# Patient Record
Sex: Female | Born: 1965 | Race: Black or African American | Hispanic: No | Marital: Single | Smoking: Current every day smoker
Health system: Southern US, Community
[De-identification: ages and names within clinical notes are randomized; demographics above are authoritative.]

## PROBLEM LIST (undated history)

## (undated) DIAGNOSIS — K5909 Other constipation: Secondary | ICD-10-CM

## (undated) DIAGNOSIS — K5010 Crohn's disease of large intestine without complications: Secondary | ICD-10-CM

## (undated) HISTORY — PX: TUBAL LIGATION: SHX77

---

## 2012-02-14 ENCOUNTER — Emergency Department: Payer: Self-pay | Admitting: Emergency Medicine

## 2012-02-14 LAB — BASIC METABOLIC PANEL
Anion Gap: 8 (ref 7–16)
Calcium, Total: 8.3 mg/dL — ABNORMAL LOW (ref 8.5–10.1)
Co2: 25 mmol/L (ref 21–32)
Creatinine: 0.64 mg/dL (ref 0.60–1.30)
EGFR (African American): >60
EGFR (Non-African Amer.): >60
Glucose: 86 mg/dL (ref 65–99)
Osmolality: 285 (ref 275–301)
Sodium: 144 mmol/L (ref 136–145)

## 2012-02-14 LAB — URINALYSIS, COMPLETE
Bacteria: NONE SEEN
Bilirubin,UR: NEGATIVE
Glucose,UR: NEGATIVE mg/dL (ref 0–75)
Leukocyte Esterase: NEGATIVE
Nitrite: NEGATIVE
RBC,UR: 24 /HPF (ref 0–5)
Specific Gravity: 1.025 (ref 1.003–1.030)
WBC UR: 1 /HPF (ref 0–5)

## 2012-02-14 LAB — CBC WITH DIFFERENTIAL/PLATELET
Basophil #: 0.1 10*3/uL (ref 0.0–0.1)
Eosinophil #: 0.1 10*3/uL (ref 0.0–0.7)
HGB: 11.2 g/dL — ABNORMAL LOW (ref 12.0–16.0)
MCH: 21.3 pg — ABNORMAL LOW (ref 26.0–34.0)
MCHC: 31.7 g/dL — ABNORMAL LOW (ref 32.0–36.0)
Monocyte #: 0.6 x10 3/mm (ref 0.2–0.9)
Monocyte %: 9.1 %
Neutrophil %: 58.4 %
Platelet: 199 10*3/uL (ref 150–440)
RBC: 5.25 10*6/uL — ABNORMAL HIGH (ref 3.80–5.20)

## 2012-09-07 ENCOUNTER — Emergency Department: Payer: Self-pay | Admitting: Emergency Medicine

## 2012-09-07 LAB — URINALYSIS, COMPLETE
Bacteria: NONE SEEN
Ketone: NEGATIVE
Nitrite: NEGATIVE
Ph: 5 (ref 4.5–8.0)
Protein: 30
RBC,UR: 17 /HPF (ref 0–5)
Specific Gravity: 1.025 (ref 1.003–1.030)
Squamous Epithelial: 21

## 2012-09-07 LAB — COMPREHENSIVE METABOLIC PANEL
Albumin: 3.9 g/dL (ref 3.4–5.0)
Anion Gap: 6 — ABNORMAL LOW (ref 7–16)
BUN: 9 mg/dL (ref 7–18)
Bilirubin,Total: 0.4 mg/dL (ref 0.2–1.0)
Calcium, Total: 9.1 mg/dL (ref 8.5–10.1)
Co2: 23 mmol/L (ref 21–32)
EGFR (Non-African Amer.): >60
Potassium: 3.6 mmol/L (ref 3.5–5.1)
SGOT(AST): 15 U/L (ref 15–37)
SGPT (ALT): 19 U/L (ref 12–78)
Total Protein: 7.7 g/dL (ref 6.4–8.2)

## 2012-09-07 LAB — CBC
HCT: 36.1 % (ref 35.0–47.0)
MCH: 21.5 pg — ABNORMAL LOW (ref 26.0–34.0)
MCHC: 33 g/dL (ref 32.0–36.0)
MCV: 65 fL — ABNORMAL LOW (ref 80–100)
Platelet: 212 10*3/uL (ref 150–440)
RDW: 15.4 % — ABNORMAL HIGH (ref 11.5–14.5)
WBC: 9.1 10*3/uL (ref 3.6–11.0)

## 2012-09-07 LAB — LIPASE, BLOOD: Lipase: 135 U/L (ref 73–393)

## 2012-10-18 ENCOUNTER — Emergency Department: Payer: Self-pay | Admitting: Emergency Medicine

## 2013-02-23 ENCOUNTER — Emergency Department: Payer: Self-pay | Admitting: Emergency Medicine

## 2013-02-23 LAB — CBC
HCT: 37.5 % (ref 35.0–47.0)
HGB: 12 g/dL (ref 12.0–16.0)
MCH: 21.4 pg — ABNORMAL LOW (ref 26.0–34.0)
MCV: 67 fL — ABNORMAL LOW (ref 80–100)
RDW: 16.2 % — ABNORMAL HIGH (ref 11.5–14.5)
WBC: 7.9 10*3/uL (ref 3.6–11.0)

## 2013-02-23 LAB — COMPREHENSIVE METABOLIC PANEL
BUN: 9 mg/dL (ref 7–18)
Bilirubin,Total: 0.2 mg/dL (ref 0.2–1.0)
Co2: 28 mmol/L (ref 21–32)
EGFR (African American): >60
Glucose: 124 mg/dL — ABNORMAL HIGH (ref 65–99)
Osmolality: 280 (ref 275–301)
Potassium: 3.1 mmol/L — ABNORMAL LOW (ref 3.5–5.1)
SGOT(AST): 29 U/L (ref 15–37)
SGPT (ALT): 29 U/L (ref 12–78)
Sodium: 140 mmol/L (ref 136–145)

## 2013-02-23 LAB — CK TOTAL AND CKMB (NOT AT ARMC): CK, Total: 94 U/L (ref 21–215)

## 2013-02-23 LAB — TROPONIN I: Troponin-I: <0.02 ng/mL

## 2013-02-23 LAB — PREGNANCY, URINE: Pregnancy Test, Urine: NEGATIVE m[IU]/mL

## 2016-02-04 ENCOUNTER — Emergency Department
Admission: EM | Admit: 2016-02-04 | Discharge: 2016-02-04 | Disposition: A | Discharge status: Home / Self Care | Payer: Self-pay | Attending: Emergency Medicine | Admitting: Emergency Medicine

## 2016-02-04 ENCOUNTER — Encounter: Payer: Self-pay | Admitting: Emergency Medicine

## 2016-02-04 ENCOUNTER — Emergency Department: Payer: Self-pay

## 2016-02-04 DIAGNOSIS — Z5321 Procedure and treatment not carried out due to patient leaving prior to being seen by health care provider: Secondary | ICD-10-CM | POA: Insufficient documentation

## 2016-02-04 DIAGNOSIS — F17200 Nicotine dependence, unspecified, uncomplicated: Secondary | ICD-10-CM | POA: Insufficient documentation

## 2016-02-04 DIAGNOSIS — R6883 Chills (without fever): Secondary | ICD-10-CM | POA: Insufficient documentation

## 2016-02-04 DIAGNOSIS — R05 Cough: Secondary | ICD-10-CM | POA: Insufficient documentation

## 2016-02-04 DIAGNOSIS — R059 Cough, unspecified: Secondary | ICD-10-CM

## 2016-02-04 DIAGNOSIS — R0602 Shortness of breath: Secondary | ICD-10-CM | POA: Insufficient documentation

## 2016-02-04 DIAGNOSIS — R0789 Other chest pain: Secondary | ICD-10-CM | POA: Insufficient documentation

## 2016-02-04 LAB — CBC
HCT: 41.2 % (ref 35.0–47.0)
Hemoglobin: 13.2 g/dL (ref 12.0–16.0)
MCH: 20.8 pg — AB (ref 26.0–34.0)
MCHC: 32.1 g/dL (ref 32.0–36.0)
MCV: 65 fL — AB (ref 80.0–100.0)
PLATELETS: 218 10*3/uL (ref 150–440)
RBC: 6.33 MIL/uL — ABNORMAL HIGH (ref 3.80–5.20)
RDW: 14.9 % — AB (ref 11.5–14.5)
WBC: 17.4 10*3/uL — AB (ref 3.6–11.0)

## 2016-02-04 LAB — BASIC METABOLIC PANEL
Anion gap: 10 (ref 5–15)
BUN: 7 mg/dL (ref 6–20)
CHLORIDE: 103 mmol/L (ref 101–111)
CO2: 24 mmol/L (ref 22–32)
CREATININE: 0.69 mg/dL (ref 0.44–1.00)
Calcium: 9.7 mg/dL (ref 8.9–10.3)
GFR calc Af Amer: >60 mL/min (ref >60)
GFR calc non Af Amer: >60 mL/min (ref >60)
Glucose, Bld: 133 mg/dL — ABNORMAL HIGH (ref 65–99)
Potassium: 3.9 mmol/L (ref 3.5–5.1)
SODIUM: 137 mmol/L (ref 135–145)

## 2016-02-04 LAB — TROPONIN I: Troponin I: 0.04 ng/mL (ref <0.03)

## 2016-02-04 NOTE — ED Provider Notes (Signed)
Patient eloped before being brought back for evaluation.   Lisa Roca, MD 02/04/16 2239

## 2016-02-04 NOTE — ED Notes (Signed)
Patient called x 3 to present for care at [place], [facility], and [facility]; no response. Patient removed from status board. Registration staff made aware.

## 2016-02-04 NOTE — ED Triage Notes (Signed)
Patient presents to the ED with her daughter complaining of cough that is productive, congestion, chills, shortness of breath and chest discomfort. Patient reports that the flu like symptoms started about a week ago when her granddaughter was sick. Had been taking OTC theraflu and even used her daughter inhaler after walking to store and developed the shortness of breath. Patient was very tearful and anxious with needles and reported the chest pressure then with this episode and starts crying off and on due to pressure in chest. Patient currently does not look to be in appearent distress.

## 2016-02-05 ENCOUNTER — Encounter: Payer: Self-pay | Admitting: Emergency Medicine

## 2016-02-05 ENCOUNTER — Telehealth: Payer: Self-pay | Admitting: Emergency Medicine

## 2016-02-05 ENCOUNTER — Emergency Department
Admission: EM | Admit: 2016-02-05 | Discharge: 2016-02-06 | Disposition: A | Discharge status: Home / Self Care | Payer: Self-pay | Attending: Emergency Medicine | Admitting: Emergency Medicine

## 2016-02-05 ENCOUNTER — Emergency Department: Payer: Self-pay

## 2016-02-05 DIAGNOSIS — F17200 Nicotine dependence, unspecified, uncomplicated: Secondary | ICD-10-CM | POA: Insufficient documentation

## 2016-02-05 DIAGNOSIS — J189 Pneumonia, unspecified organism: Secondary | ICD-10-CM

## 2016-02-05 DIAGNOSIS — R0789 Other chest pain: Secondary | ICD-10-CM | POA: Insufficient documentation

## 2016-02-05 DIAGNOSIS — Z5321 Procedure and treatment not carried out due to patient leaving prior to being seen by health care provider: Secondary | ICD-10-CM | POA: Insufficient documentation

## 2016-02-05 LAB — BASIC METABOLIC PANEL
Anion gap: 8 (ref 5–15)
BUN: 11 mg/dL (ref 6–20)
CALCIUM: 9.2 mg/dL (ref 8.9–10.3)
CHLORIDE: 103 mmol/L (ref 101–111)
CO2: 27 mmol/L (ref 22–32)
CREATININE: 0.71 mg/dL (ref 0.44–1.00)
GFR calc non Af Amer: >60 mL/min (ref >60)
Glucose, Bld: 101 mg/dL — ABNORMAL HIGH (ref 65–99)
Potassium: 3.7 mmol/L (ref 3.5–5.1)
SODIUM: 138 mmol/L (ref 135–145)

## 2016-02-05 LAB — CBC
HCT: 36.9 % (ref 35.0–47.0)
Hemoglobin: 12.1 g/dL (ref 12.0–16.0)
MCH: 20.9 pg — AB (ref 26.0–34.0)
MCHC: 32.7 g/dL (ref 32.0–36.0)
MCV: 63.8 fL — AB (ref 80.0–100.0)
PLATELETS: 217 10*3/uL (ref 150–440)
RBC: 5.78 MIL/uL — AB (ref 3.80–5.20)
RDW: 15 % — AB (ref 11.5–14.5)
WBC: 11.1 10*3/uL — ABNORMAL HIGH (ref 3.6–11.0)

## 2016-02-05 LAB — TROPONIN I

## 2016-02-05 MED ORDER — AZITHROMYCIN 250 MG PO TABS: ORAL_TABLET | ORAL | 0 refills | Status: AC

## 2016-02-05 MED ORDER — AZITHROMYCIN 500 MG PO TABS
500.0000 mg | ORAL_TABLET | Freq: Once | ORAL | Status: AC
  Administered 2016-02-06: 500 mg via ORAL
  Filled 2016-02-05: qty 1

## 2016-02-05 MED ORDER — DEXTROSE 5 % IV SOLN
1.0000 g | Freq: Once | INTRAVENOUS | Status: AC
  Administered 2016-02-06: 1 g via INTRAVENOUS
  Filled 2016-02-05: qty 10

## 2016-02-05 MED ORDER — IPRATROPIUM-ALBUTEROL 0.5-2.5 (3) MG/3ML IN SOLN
3.0000 mL | Freq: Once | RESPIRATORY_TRACT | Status: AC
  Administered 2016-02-06: 3 mL via RESPIRATORY_TRACT
  Filled 2016-02-05: qty 3

## 2016-02-05 MED ORDER — ALBUTEROL SULFATE HFA 108 (90 BASE) MCG/ACT IN AERS: 2.0000 | INHALATION_SPRAY | Freq: Four times a day (QID) | RESPIRATORY_TRACT | 2 refills | Status: DC | PRN

## 2016-02-05 MED ORDER — AMOXICILLIN-POT CLAVULANATE 875-125 MG PO TABS
1.0000 | ORAL_TABLET | Freq: Two times a day (BID) | ORAL | 0 refills | Status: AC
Start: 2016-02-05 — End: 2016-02-15

## 2016-02-05 NOTE — ED Provider Notes (Signed)
[facility] Emergency Department Provider Note   ____________________________________________   First MD Initiated Contact with Patient 02/05/16 2332     (approximate)  I have reviewed the triage vital signs and the nursing notes.   HISTORY  Chief Complaint Chest Pain   HPI Katelyn Garrett is a 50 y.o. female patient reports she's been sick for about a week and a half chest is been hurting for about 3 days hurts worse when she coughs she's been coughing running a fever of  101 also for the last 3-4 days. Cough is wet but nothing much is really coming up. Pain and cough for moderate shortness of breath is mild      History reviewed. No pertinent past medical history.  There are no active problems to display for this patient.   Past Surgical History:  Procedure Laterality Date  . TUBAL LIGATION      Prior to Admission medications   Medication Sig Start Date End Date Taking? Authorizing Provider  albuterol (PROVENTIL HFA;VENTOLIN HFA) 108 (90 Base) MCG/ACT inhaler Inhale 2 puffs into the lungs every 6 (six) hours as needed for wheezing or shortness of breath. 02/05/16   Nena Polio, MD  amoxicillin-clavulanate (AUGMENTIN) 875-125 MG tablet Take 1 tablet by mouth 2 (two) times daily. 02/05/16 02/15/16  Nena Polio, MD  azithromycin (ZITHROMAX Z-PAK) 250 MG tablet Take 2 tablets (500 mg) on  Day 1,  followed by 1 tablet (250 mg) once daily on Days 2 through 5. 02/05/16 02/10/16  Nena Polio, MD  ondansetron (ZOFRAN ODT) 4 MG disintegrating tablet Take 1 tablet (4 mg total) by mouth every 8 (eight) hours as needed for nausea or vomiting. 02/06/16   Nena Polio, MD    Allergies Review of patient's allergies indicates no known allergies.  History reviewed. No pertinent family history.  Social History Social History  Substance Use Topics  . Smoking status: Current Every Day Smoker  . Smokeless tobacco: Never Used  . Alcohol use No    Review  of Systems Constitutional:  fever/No chills Eyes: No visual changes. ENT: No sore throat. Cardiovascular: Some pleuritic chest pain Respiratory: Some shortness of breath. Gastrointestinal: No abdominal pain.  No nausea, no vomiting.  No diarrhea.  No constipation. Genitourinary: Negative for dysuria. Musculoskeletal: Negative for back pain. Skin: Negative for rash. Neurological: Negative for headaches, focal weakness or numbness.  10-point ROS otherwise negative.  ____________________________________________   PHYSICAL EXAM:  VITAL SIGNS: ED Triage Vitals  Enc Vitals Group     BP 02/05/16 2117 132/90     Pulse Rate 02/05/16 2117 86     Resp 02/05/16 2117 20     Temp 02/05/16 2117 99 F (37.2 C)     Temp Source 02/05/16 2117 Oral     SpO2 02/05/16 2117 94 %     Weight 02/05/16 2117 140 lb (63.5 kg)     Height 02/05/16 2117 5' 7"  (1.702 m)     Head Circumference --      Peak Flow --      Pain Score 02/05/16 2118 8     Pain Loc --      Pain Edu? --      Excl. in [place]? --     Constitutional: Alert and oriented. Well appearing and in no acute distress. Eyes: Conjunctivae are normal. PERRL. EOMI. Head: Atraumatic. Nose: No congestion/rhinnorhea. Mouth/Throat: Mucous membranes are moist.  Oropharynx non-erythematous. Neck: No stridor.  Cardiovascular: Normal rate,  regular rhythm. Grossly normal heart sounds.  Good peripheral circulation. Respiratory: Normal respiratory effort.  No retractions. Lungs CTAB! Gastrointestinal: Soft and nontender. No distention. No abdominal bruits. No CVA tenderness. Musculoskeletal: No lower extremity tenderness nor edema.  No joint effusions. Neurologic:  Normal speech and language. No gross focal neurologic deficits are appreciated.  Skin:  Skin is warm, dry and intact. No rash noted.   ____________________________________________   LABS (all labs ordered are listed, but only abnormal results are displayed)  Labs Reviewed  BASIC  METABOLIC PANEL - Abnormal; Notable for the following:       Result Value   Glucose, Bld 101 (*)    All other components within normal limits  CBC - Abnormal; Notable for the following:    WBC 11.1 (*)    RBC 5.78 (*)    MCV 63.8 (*)    MCH 20.9 (*)    RDW 15.0 (*)    All other components within normal limits  TROPONIN I   ____________________________________________  EKG  EKG read and interpreted by me shows normal sinus rhythm rate of 89 normal axis and normal EKG ____________________________________________  RADIOLOGY  EXAM: CHEST  2 VIEW  COMPARISON:  02/23/2013 chest radiograph.  FINDINGS: Stable cardiomediastinal silhouette within normal limits. Ill-defined left lower lobe opacity. Possible small left pleural effusion. No pneumothorax. Mild degenerative changes of the spine  IMPRESSION: Ill-defined left lower lobe opacity and possible small left effusion suspicious for pneumonia.  These results were called by telephone at the time of interpretation on 02/05/2016 at 10:33 pm to Dr. Eula Listen , who verbally acknowledged these results.   Electronically Signed   By: Kristine Garbe M.D.   On: 02/05/2016 22:34 ____________________________________________   PROCEDURES  Procedure(s) performed:   Procedures  Critical Care performed:  ____________________________________________   INITIAL IMPRESSION / ASSESSMENT AND PLAN / ED COURSE  Pertinent labs & imaging results that were available during my care of the patient were reviewed by me and considered in my medical decision making (see chart for details).    Clinical Course     ____________________________________________   FINAL CLINICAL IMPRESSION(S) / ED DIAGNOSES  Final diagnoses:  Community acquired pneumonia      NEW MEDICATIONS STARTED DURING THIS VISIT:  Discharge Medication List as of 02/06/2016  1:30 AM    START taking these medications   Details    albuterol (PROVENTIL HFA;VENTOLIN HFA) 108 (90 Base) MCG/ACT inhaler Inhale 2 puffs into the lungs every 6 (six) hours as needed for wheezing or shortness of breath., Starting Fri 02/05/2016, Print    amoxicillin-clavulanate (AUGMENTIN) 875-125 MG tablet Take 1 tablet by mouth 2 (two) times daily., Starting Fri 02/05/2016, Until Mon 02/15/2016, Print    azithromycin (ZITHROMAX Z-PAK) 250 MG tablet Take 2 tablets (500 mg) on  Day 1,  followed by 1 tablet (250 mg) once daily on Days 2 through 5., Print    ondansetron (ZOFRAN ODT) 4 MG disintegrating tablet Take 1 tablet (4 mg total) by mouth every 8 (eight) hours as needed for nausea or vomiting., Starting Sat 02/06/2016, Print         Note:  This document was prepared using Dragon voice recognition software and may include unintentional dictation errors.    Nena Polio, MD 02/06/16 [phone]

## 2016-02-05 NOTE — ED Notes (Addendum)
Patient not in room at this time. Will assess once back in room.

## 2016-02-05 NOTE — ED Triage Notes (Signed)
Pt ambulatory to triage in NAD, was here yesterday for CP but LWBS, called to come back due to elevated WBC.  Pt reports central CP described as tight, SOB, n/v.  Pt NAD at this time.

## 2016-02-05 NOTE — ED Provider Notes (Signed)
I was called by the radiologist for an abnormal chest x-ray which shows a left lower lobe pneumonia. The charge nurse is aware.   Eula Listen, MD 02/05/16 2234

## 2016-02-05 NOTE — Telephone Encounter (Signed)
Called patient due to lwot to inquire about condition and follow up plans. I explained elevated troponin and strongly encouraged patient to return for provider exam.

## 2016-02-06 MED ORDER — ONDANSETRON 4 MG PO TBDP: 4.0000 mg | ORAL_TABLET | Freq: Three times a day (TID) | ORAL | 0 refills | Status: DC | PRN

## 2016-02-06 NOTE — Discharge Instructions (Signed)
I'm giving you to different antibiotics Zithromax Z-Pak to tomorrow and then one every day after that and then the Augmentin 1 pill twice a day with food. Sometimes the Augmentin especially can cause diarrhea and he don't take it with food. Use the albuterol inhaler 2 puffs 4 times a day might help with the cough if it makes a cough where she can stop it. He some Zofran melt on your tongue antinausea medicine to help with the vomiting that she were having. Please return for higher fever or feeling sicker more short of breath or any other problems. Lee's follow-up with her regular doctor in the next week or so. He can call [facility] or [facility] or [facility] or you can try the [facility] or [facility].

## 2016-02-10 ENCOUNTER — Telehealth: Payer: Self-pay | Admitting: Emergency Medicine

## 2016-02-10 NOTE — Telephone Encounter (Signed)
Called patient due to pt on both zpack and augmentin--[facility] had left me message.  Today they said that patient had picked up both meds.  After discussing with dr Corky Downs, we were going to tell them to only fill the zpack.  I tried to call the patient , but her phone does not take calls.

## 2016-10-02 ENCOUNTER — Encounter: Payer: Self-pay | Admitting: Radiology

## 2016-10-02 ENCOUNTER — Emergency Department: Payer: Self-pay

## 2016-10-02 ENCOUNTER — Inpatient Hospital Stay
Admission: EM | Admit: 2016-10-02 | Discharge: 2016-10-03 | DRG: 390 | Discharge status: Against Medical Advice | Payer: Self-pay | Attending: Surgery | Admitting: Surgery

## 2016-10-02 DIAGNOSIS — K561 Intussusception: Principal | ICD-10-CM | POA: Diagnosis present

## 2016-10-02 DIAGNOSIS — R1031 Right lower quadrant pain: Secondary | ICD-10-CM

## 2016-10-02 DIAGNOSIS — F17210 Nicotine dependence, cigarettes, uncomplicated: Secondary | ICD-10-CM | POA: Diagnosis present

## 2016-10-02 HISTORY — DX: Other constipation: K59.09

## 2016-10-02 LAB — COMPREHENSIVE METABOLIC PANEL
ALBUMIN: 4.6 g/dL (ref 3.5–5.0)
ALT: 13 U/L — ABNORMAL LOW (ref 14–54)
AST: 21 U/L (ref 15–41)
Alkaline Phosphatase: 134 U/L — ABNORMAL HIGH (ref 38–126)
Anion gap: 10 (ref 5–15)
BUN: 8 mg/dL (ref 6–20)
CHLORIDE: 106 mmol/L (ref 101–111)
CO2: 26 mmol/L (ref 22–32)
Calcium: 9.6 mg/dL (ref 8.9–10.3)
Creatinine, Ser: 0.68 mg/dL (ref 0.44–1.00)
GFR calc Af Amer: >60 mL/min (ref >60)
GFR calc non Af Amer: >60 mL/min (ref >60)
GLUCOSE: 98 mg/dL (ref 65–99)
POTASSIUM: 3.5 mmol/L (ref 3.5–5.1)
Sodium: 142 mmol/L (ref 135–145)
Total Bilirubin: 0.5 mg/dL (ref 0.3–1.2)
Total Protein: 8.4 g/dL — ABNORMAL HIGH (ref 6.5–8.1)

## 2016-10-02 LAB — LIPASE, BLOOD: LIPASE: 26 U/L (ref 11–51)

## 2016-10-02 LAB — URINALYSIS, COMPLETE (UACMP) WITH MICROSCOPIC
BACTERIA UA: NONE SEEN
Bilirubin Urine: NEGATIVE
Glucose, UA: NEGATIVE mg/dL
Ketones, ur: 5 mg/dL — AB
LEUKOCYTES UA: NEGATIVE
Nitrite: NEGATIVE
PROTEIN: NEGATIVE mg/dL
SPECIFIC GRAVITY, URINE: 1.019 (ref 1.005–1.030)
pH: 5 (ref 5.0–8.0)

## 2016-10-02 LAB — CBC
HEMATOCRIT: 39.4 % (ref 35.0–47.0)
Hemoglobin: 12.5 g/dL (ref 12.0–16.0)
MCH: 21 pg — ABNORMAL LOW (ref 26.0–34.0)
MCHC: 31.7 g/dL — AB (ref 32.0–36.0)
MCV: 66.1 fL — ABNORMAL LOW (ref 80.0–100.0)
Platelets: 225 10*3/uL (ref 150–440)
RBC: 5.95 MIL/uL — ABNORMAL HIGH (ref 3.80–5.20)
RDW: 17.3 % — AB (ref 11.5–14.5)
WBC: 9.4 10*3/uL (ref 3.6–11.0)

## 2016-10-02 LAB — PREGNANCY, URINE: Preg Test, Ur: NEGATIVE

## 2016-10-02 MED ORDER — ONDANSETRON 4 MG PO TBDP: 4.0000 mg | ORAL_TABLET | Freq: Four times a day (QID) | ORAL | Status: DC | PRN

## 2016-10-02 MED ORDER — KETOROLAC TROMETHAMINE 30 MG/ML IJ SOLN
INTRAMUSCULAR | Status: AC
  Administered 2016-10-02: 30 mg via INTRAVENOUS
  Filled 2016-10-02: qty 1

## 2016-10-02 MED ORDER — SORBITOL 70 % SOLN
960.0000 mL | TOPICAL_OIL | Freq: Once | ORAL | Status: AC
  Administered 2016-10-03: 960 mL via RECTAL
  Filled 2016-10-02: qty 240

## 2016-10-02 MED ORDER — KCL IN DEXTROSE-NACL 20-5-0.45 MEQ/L-%-% IV SOLN
INTRAVENOUS | Status: DC
  Administered 2016-10-03 (×2): via INTRAVENOUS
  Filled 2016-10-02 (×5): qty 1000

## 2016-10-02 MED ORDER — MORPHINE SULFATE (PF) 2 MG/ML IV SOLN
2.0000 mg | Freq: Once | INTRAVENOUS | Status: AC
Start: 2016-10-02 — End: 2016-10-02
  Administered 2016-10-02: 2 mg via INTRAVENOUS
  Filled 2016-10-02: qty 1

## 2016-10-02 MED ORDER — ONDANSETRON HCL 4 MG/2ML IJ SOLN
INTRAMUSCULAR | Status: AC
  Administered 2016-10-02: 4 mg via INTRAVENOUS
  Filled 2016-10-02: qty 2

## 2016-10-02 MED ORDER — FLEET ENEMA 7-19 GM/118ML RE ENEM: 1.0000 | ENEMA | Freq: Once | RECTAL | Status: DC | PRN

## 2016-10-02 MED ORDER — ONDANSETRON HCL 4 MG/2ML IJ SOLN
4.0000 mg | Freq: Once | INTRAMUSCULAR | Status: AC
  Administered 2016-10-02: 4 mg via INTRAVENOUS
  Filled 2016-10-02: qty 2

## 2016-10-02 MED ORDER — IOPAMIDOL (ISOVUE-300) INJECTION 61%
100.0000 mL | Freq: Once | INTRAVENOUS | Status: AC | PRN
  Administered 2016-10-02: 100 mL via INTRAVENOUS

## 2016-10-02 MED ORDER — IOPAMIDOL (ISOVUE-300) INJECTION 61%
30.0000 mL | Freq: Once | INTRAVENOUS | Status: AC | PRN
Start: 2016-10-02 — End: 2016-10-02
  Administered 2016-10-02: 30 mL via ORAL

## 2016-10-02 MED ORDER — KETOROLAC TROMETHAMINE 30 MG/ML IJ SOLN
30.0000 mg | Freq: Once | INTRAMUSCULAR | Status: AC
  Administered 2016-10-02: 30 mg via INTRAVENOUS

## 2016-10-02 MED ORDER — ENOXAPARIN SODIUM 40 MG/0.4ML ~~LOC~~ SOLN
30.0000 mg | SUBCUTANEOUS | Status: DC
  Administered 2016-10-03: 30 mg via SUBCUTANEOUS
  Filled 2016-10-02: qty 0.4

## 2016-10-02 MED ORDER — ONDANSETRON HCL 4 MG/2ML IJ SOLN
4.0000 mg | Freq: Four times a day (QID) | INTRAMUSCULAR | Status: DC | PRN
  Administered 2016-10-02: 4 mg via INTRAVENOUS

## 2016-10-02 MED ORDER — SODIUM CHLORIDE 0.9 % IV BOLUS (SEPSIS)
500.0000 mL | Freq: Once | INTRAVENOUS | Status: AC
  Administered 2016-10-02: 500 mL via INTRAVENOUS

## 2016-10-02 MED ORDER — KETOROLAC TROMETHAMINE 15 MG/ML IJ SOLN: 15.0000 mg | Freq: Four times a day (QID) | INTRAMUSCULAR | Status: DC | PRN

## 2016-10-02 NOTE — H&P (Addendum)
SURGICAL HISTORY & PHYSICAL (cpt [phone])  HISTORY OF PRESENT ILLNESS (HPI):  51 y.o. female presented to [facility] ED with RLQ abdominal pain x 8 hours (since ~2 pm while at work). She reports she first began feeling lower abdominal "pressure" as if she needed to have a BM 3 days ago. A few weeks ago, she experience similar abdominal "pressure", for which she took Miralax, and the "pressure" resolved after a large BM. Yesterday, the "pressure" again resolved after a large BM. When the "pressure" recurred while patient was standing all day at work, she reports she went to the bathroom and tried to strain as hard as she could to push out a BM. After such straining, she developed the RLQ abdominal pain for which she presented. Patient denies any N/V, fever/chills, CP, or SOB, and patient's husband states that she has had difficulty with constipation resulting in hard and difficult BM's, but has not previously sought care for it due to lack of insurance and has not previously experienced RLQ pain. Patient currently reports her pain is minimal, though she continues to report the lower abdominal "pressure".  PAST MEDICAL HISTORY ([facility]):  Past Medical History:  Diagnosis Date  . Chronic constipation     Reviewed. Otherwise negative.   PAST SURGICAL HISTORY ([place]):  Past Surgical History:  Procedure Laterality Date  . TUBAL LIGATION (laparoscopic)      Reviewed. Otherwise negative.   MEDICATIONS:  Prior to Admission medications   Medication Sig Start Date End Date Taking? Authorizing Provider  albuterol (PROVENTIL HFA;VENTOLIN HFA) 108 (90 Base) MCG/ACT inhaler Inhale 2 puffs into the lungs every 6 (six) hours as needed for wheezing or shortness of breath. Patient not taking: Reported on 10/02/2016 02/05/16   Nena Polio, MD  ondansetron (ZOFRAN ODT) 4 MG disintegrating tablet Take 1 tablet (4 mg total) by mouth every 8 (eight) hours as needed for nausea or vomiting. Patient not taking: Reported on  10/02/2016 02/06/16   Nena Polio, MD     ALLERGIES:  No Known Allergies   SOCIAL HISTORY:  Social History   Social History  . Marital status: Single    Spouse name: N/A  . Number of children: N/A  . Years of education: N/A   Occupational History  . Not on file.   Social History Main Topics  . Smoking status: Current Every Day Smoker  . Smokeless tobacco: Never Used  . Alcohol use No  . Drug use: Unknown  . Sexual activity: Not on file   Other Topics Concern  . Not on file   Social History Narrative  . No narrative on file    The patient currently resides (home / rehab facility / nursing home): Home  The patient normally is (ambulatory / bedbound) : Ambulatory   FAMILY HISTORY:  No family history on file.  Otherwise negative.   REVIEW OF SYSTEMS:  Constitutional: denies any other weight loss, fever, chills, or sweats  Eyes: denies any other vision changes, history of eye injury  ENT: denies sore throat, hearing problems  Respiratory: denies shortness of breath, wheezing  Cardiovascular: denies chest pain, palpitations  Gastrointestinal: abdominal pain, N/V, and bowel function as per HPI  Genitourinary: denies burning with urination or urinary frequency Musculoskeletal: denies any other joint pains or cramps  Skin: Denies any other rashes or skin discolorations  Neurological: denies any other headache, dizziness, weakness  Psychiatric: denies any other depression, anxiety   All other review of systems were otherwise negative.  VITAL  SIGNS:  Temp:  [98.5 F (36.9 C)] 98.5 F (36.9 C) (05/27 1615) Pulse Rate:  [47-78] 56 (05/27 2300) Resp:  [18] 18 (05/27 2300) BP: (98-139)/(71-97) 98/73 (05/27 2300) SpO2:  [97 %-100 %] 100 % (05/27 2300) Weight:  [178 lb (80.7 kg)] 178 lb (80.7 kg) (05/27 1616)     Height: 5' 7"  (170.2 cm) Weight: 178 lb (80.7 kg) BMI (Calculated): 27.9   INTAKE/OUTPUT:  This shift: Total I/O In: 500 [IV Piggyback:500] Out: -   Last  2 shifts: @IOLAST2SHIFTS @  PHYSICAL EXAM:  Constitutional:  -- Normal body habitus  -- Awake, alert, and oriented x3  Eyes:  -- Pupils equally round and reactive to light  -- No scleral icterus  Ear, nose, throat:  -- No jugular venous distension  Pulmonary:  -- No crackles  -- Equal breath sounds bilaterally -- Breathing non-labored at rest Cardiovascular:  -- S1, S2 present  -- No pericardial rubs  Gastrointestinal:  -- Abdomen soft and non-distended with minimal RLQ abdominal tenderness to palpation, no guarding/rebound  -- No abdominal masses appreciated, pulsatile or otherwise  Musculoskeletal and Integumentary:  -- Wounds or skin discoloration: Well-healed infra-umbilical transverse linear incision consistent with laparoscopic tubal ligation history -- Extremities: B/L UE and LE FROM, hands and feet warm, no edema  Neurologic:  -- Motor function: Intact and symmetric -- Sensation: Intact and symmetric  Labs:  CBC Latest Ref Rng & Units 10/02/2016 02/05/2016 02/04/2016  WBC 3.6 - 11.0 K/uL 9.4 11.1(H) 17.4(H)  Hemoglobin 12.0 - 16.0 g/dL 12.5 12.1 13.2  Hematocrit 35.0 - 47.0 % 39.4 36.9 41.2  Platelets 150 - 440 K/uL 225 217 218   CMP Latest Ref Rng & Units 10/02/2016 02/05/2016 02/04/2016  Glucose 65 - 99 mg/dL 98 101(H) 133(H)  BUN 6 - 20 mg/dL 8 11 7   Creatinine 0.44 - 1.00 mg/dL 0.68 0.71 0.69  Sodium 135 - 145 mmol/L 142 138 137  Potassium 3.5 - 5.1 mmol/L 3.5 3.7 3.9  Chloride 101 - 111 mmol/L 106 103 103  CO2 22 - 32 mmol/L 26 27 24   Calcium 8.9 - 10.3 mg/dL 9.6 9.2 9.7  Total Protein 6.5 - 8.1 g/dL 8.4(H) - -  Total Bilirubin 0.3 - 1.2 mg/dL 0.5 - -  Alkaline Phos 38 - 126 U/L 134(H) - -  AST 15 - 41 U/L 21 - -  ALT 14 - 54 U/L 13(L) - -    Imaging studies:  CT Abdomen and Pelvis with Contrast (10/02/2016) - personally reviewed with patient and her husband bedside, discussed with ED physician Normal appendix. Stomach well distended and normal appearance.  Colon normal appearance. Wall thickening of distal ileum with a short segment of ileo-ileal intussusception approximately 3 cm length and the distal ileum. No definite mass lesion lead point is delineated. Remaining small bowel loops unremarkable without evidence of obstruction.  Assessment/Plan: (ICD-10's: K56.1, K59.04) 51 y.o. female with non-obstructing ileo-ileal intussusception, likely attributable to acute on chronic constipation, though tumor or Meckel's diverticulum cannot yet be excluded, complicated by pertinent comorbidities including chronic untreated constipation and lack of routine medical care.    - NPO with IV fluids, minimize narcotics  - monitor abdominal exam and bowel function   - administer enemas along with oral contrast already ingested  - discussed with patient and her husband may still require surgery  - if pain resolves with management of constipation, will require repeat imaging (small bowel follow-through vs CT)  - ambulation encouraged  - DVT prophylaxis  All of the above findings and recommendations were discussed with the patient and her husband (bedside), and all of her and her husband's questions were answered to their expressed satisfaction.  -- Marilynne Drivers Rosana Hoes, MD, [place]: [facility] - Partnering for exceptional care. Office: [phone]

## 2016-10-02 NOTE — ED Notes (Signed)
Dr Rosana Hoes consulted for pt POC att, no op procedure, advised of returning pain, expected orders for NSAIDS

## 2016-10-02 NOTE — ED Provider Notes (Signed)
[facility] Emergency Department Provider Note ____________________________________________   First MD Initiated Contact with Patient 10/02/16 1755     (approximate)  I have reviewed the triage vital signs and the nursing notes.   HISTORY  Chief Complaint Abdominal Pain  HPI Katelyn Garrett is a 51 y.o. female who reports she's had slowly increasing heart describe in bowel pain in the last 3 or so days. It is located in the right lower abdomen. Not associated with a loss of appetite or fevers. No nausea or vomiting. Describes a moderate firm and expanding pressure sensation in the right lower abdomen. Denies any vaginal discharge. She went through menopause over 5 years ago. Only surgical history is that of a right sided ectopic pregnancy in the distant past. She denies pregnancy today. Vaginal discharge. No pain or burning with urination.   History reviewed. No pertinent past medical history.  There are no active problems to display for this patient.   Past Surgical History:  Procedure Laterality Date  . TUBAL LIGATION      Prior to Admission medications   Medication Sig Start Date End Date Taking? Authorizing Provider  albuterol (PROVENTIL HFA;VENTOLIN HFA) 108 (90 Base) MCG/ACT inhaler Inhale 2 puffs into the lungs every 6 (six) hours as needed for wheezing or shortness of breath. Patient not taking: Reported on 10/02/2016 02/05/16   Nena Polio, MD  ondansetron (ZOFRAN ODT) 4 MG disintegrating tablet Take 1 tablet (4 mg total) by mouth every 8 (eight) hours as needed for nausea or vomiting. Patient not taking: Reported on 10/02/2016 02/06/16   Nena Polio, MD    Allergies Patient has no known allergies.  No family history on file.  Social History Social History  Substance Use Topics  . Smoking status: Current Every Day Smoker  . Smokeless tobacco: Never Used  . Alcohol use No    Review of Systems Constitutional: No  fever/chills Eyes: No visual changes. ENT: No sore throat. Cardiovascular: Denies chest pain. Respiratory: Denies shortness of breath. Gastrointestinal: No nausea, no vomiting.  No diarrhea.  No constipation. Genitourinary: Negative for dysuria. Musculoskeletal: Negative for back pain. Skin: Negative for rash. Neurological: Negative for headaches, focal weakness or numbness.    ____________________________________________   PHYSICAL EXAM:  VITAL SIGNS: ED Triage Vitals  Enc Vitals Group     BP 10/02/16 1615 128/84     Pulse Rate 10/02/16 1615 78     Resp 10/02/16 1615 18     Temp 10/02/16 1615 98.5 F (36.9 C)     Temp Source 10/02/16 1615 Oral     SpO2 10/02/16 1615 100 %     Weight 10/02/16 1616 178 lb (80.7 kg)     Height 10/02/16 1616 5' 7"  (1.702 m)     Head Circumference --      Peak Flow --      Pain Score 10/02/16 1728 8     Pain Loc --      Pain Edu? --      Excl. in [place]? --     Constitutional: Alert and oriented. Well appearing and in no acute distress.Patient is very pleasant. Eyes: Conjunctivae are normal. Head: Atraumatic. Nose: No congestion/rhinnorhea. Mouth/Throat: Mucous membranes are moist. Neck: No stridor.   Cardiovascular: Normal rate, regular rhythm. Grossly normal heart sounds.  Good peripheral circulation. Respiratory: Normal respiratory effort.  No retractions. Lungs CTAB. Gastrointestinal: Soft and nontender except first focal tenderness in the right lower quadrant, some focally at McBurney's  point with minimal discomfort in the right lower quadrant when palpating in the left lower abdomen. No distention. Musculoskeletal: No lower extremity tenderness nor edema. Neurologic:  Normal speech and language. No gross focal neurologic deficits are appreciated.  Skin:  Skin is warm, dry and intact. No rash noted. Psychiatric: Mood and affect are normal. Speech and behavior are normal.  ____________________________________________   LABS (all  labs ordered are listed, but only abnormal results are displayed)  Labs Reviewed  COMPREHENSIVE METABOLIC PANEL - Abnormal; Notable for the following:       Result Value   Total Protein 8.4 (*)    ALT 13 (*)    Alkaline Phosphatase 134 (*)    All other components within normal limits  CBC - Abnormal; Notable for the following:    RBC 5.95 (*)    MCV 66.1 (*)    MCH 21.0 (*)    MCHC 31.7 (*)    RDW 17.3 (*)    All other components within normal limits  URINALYSIS, COMPLETE (UACMP) WITH MICROSCOPIC - Abnormal; Notable for the following:    Color, Urine YELLOW (*)    APPearance CLEAR (*)    Hgb urine dipstick MODERATE (*)    Ketones, ur 5 (*)    Squamous Epithelial / LPF 0-5 (*)    All other components within normal limits  LIPASE, BLOOD  PREGNANCY, URINE   ____________________________________________  EKG   ____________________________________________  RADIOLOGY  Ct Abdomen Pelvis W Contrast  Result Date: 10/02/2016 CLINICAL DATA:  RIGHT lower abdominal pain and pressure in pelvis increasing for the past few days, feels like she has to push really hard for bowel movements, worsened pain from sitting to standing, history smoking EXAM: CT ABDOMEN AND PELVIS WITH CONTRAST TECHNIQUE: Multidetector CT imaging of the abdomen and pelvis was performed using the standard protocol following bolus administration of intravenous contrast. Sagittal and coronal MPR images reconstructed from axial data set. CONTRAST:  148m ISOVUE-300 IOPAMIDOL (ISOVUE-300) INJECTION 61% IV. Dilute oral contrast. COMPARISON:  09/07/2012 FINDINGS: Lower chest: Subpleural nodule RIGHT middle lobe 5 mm diameter image 1 unchanged. Remaining lung bases clear. Hepatobiliary: Gallbladder and liver normal appearance Pancreas: Normal appearance Spleen: Normal appearance Adrenals/Urinary Tract: Adrenal glands normal appearance. Kidneys, ureters, and bladder normal appearance Stomach/Bowel: Normal appendix. Stomach well  distended and normal appearance. Colon normal appearance. Wall thickening of distal ileum with a short segment of ileo-ileal intussusception approximately 3 cm length and the distal ileum. No definite mass lesion lead point is delineated. Remaining small bowel loops unremarkable without evidence of obstruction. Vascular/Lymphatic: Vascular structures unremarkable. Few normal size mesenteric lymph nodes in RIGHT mid abdomen. Reproductive: Unremarkable uterus and adnexa Other: No free air or free fluid. Musculoskeletal: Osseous structures unremarkable. IMPRESSION: Wall thickening of the distal ileum with a short segment of ileo-ileal intussusception approximately 3 cm in length without definite evidence of bowel obstruction. Stable 5 mm subpleural nodule RIGHT middle lobe since 2014. No additional intra-abdominal or intrapelvic abnormalities. Electronically Signed   By: MLavonia DanaM.D.   On: 10/02/2016 20:01     ____________________________________________   PROCEDURES  Procedure(s) performed: None  Procedures  Critical Care performed: No  ____________________________________________   INITIAL IMPRESSION / ASSESSMENT AND PLAN / ED COURSE  Pertinent labs & imaging results that were available during my care of the patient were reviewed by me and considered in my medical decision making (see chart for details).  Differential diagnosis includes but is not limited to, abdominal perforation, aortic dissection, cholecystitis,  appendicitis, diverticulitis, colitis, esophagitis/gastritis, kidney stone, pyelonephritis, urinary tract infection, aortic aneurysm. All are considered in decision and treatment plan. Based upon the patient's presentation and risk factors, I am concerned about the focality of slowly increasing pain in the right lower abdomen. We'll proceed with obtaining labs and obtain a CT scan to exclude intra-abdominal pathology, in particular evaluate for appendicitis. No right upper  quadrant pain. No back pain or urinary symptoms. Denies any pelvic symptomatology.  Discussed case with surgery, Dr. Rosana Hoes. See patient in consult, CT with a questionable concern for intussusception.  ----------------------------------------- 10:16 PM on 10/02/2016 -----------------------------------------  Discussed with Dr. Rosana Hoes, admitting to general surgery. Patient reports pain well controlled. Resting comfortably. On reexam does continue to have persistent pain in the right lower quadrant on palpation       ____________________________________________   FINAL CLINICAL IMPRESSION(S) / ED DIAGNOSES  Final diagnoses:  Right lower quadrant abdominal pain  Intussusception ([place])      NEW MEDICATIONS STARTED DURING THIS VISIT:  New Prescriptions   No medications on file     Note:  This document was prepared using Dragon voice recognition software and may include unintentional dictation errors.     Delman Kitten, MD 10/02/16 2216

## 2016-10-02 NOTE — ED Notes (Signed)
Pt reports BM as loose stool, increasing pain over the last 4 days, POC shared, pt urinated on self after scan, given changes of pants, sock and underwear  Sig other, Nicole Kindred, [phone], leaving to go home for comforts will return

## 2016-10-02 NOTE — ED Triage Notes (Addendum)
Pt presents to ED c/o right lower abd pain with pressure on pelvis since for the past few days. Pt states that she feels like she has to "push" really hard for bowel movements. Pt states pain is worse from sitting to standing . Denies urgency, frequency, and n/v

## 2016-10-02 NOTE — ED Notes (Signed)
Pt reports that she is having right side pain and lower abd pressure - this has been present for 3-4 days - denies N/V/diarrhea - pt states she feels like she has to have a bowel movement all day but she does not - pt states she has regular bowel movements without difficulty and last BM was this am - pt denies difficulty or pain with urination

## 2016-10-03 ENCOUNTER — Encounter
Admission: EM | Discharge status: Against Medical Advice | Payer: Self-pay | Source: Home / Self Care | Attending: Surgery

## 2016-10-03 ENCOUNTER — Inpatient Hospital Stay: Payer: Self-pay

## 2016-10-03 ENCOUNTER — Encounter: Payer: Self-pay | Admitting: *Deleted

## 2016-10-03 DIAGNOSIS — K561 Intussusception: Principal | ICD-10-CM

## 2016-10-03 DIAGNOSIS — R1031 Right lower quadrant pain: Secondary | ICD-10-CM

## 2016-10-03 LAB — CBC WITH DIFFERENTIAL/PLATELET
BASOS PCT: 1 %
Basophils Absolute: 0 10*3/uL (ref 0–0.1)
Eosinophils Absolute: 0.2 10*3/uL (ref 0–0.7)
Eosinophils Relative: 3 %
HEMATOCRIT: 32.8 % — AB (ref 35.0–47.0)
HEMOGLOBIN: 10.7 g/dL — AB (ref 12.0–16.0)
Lymphocytes Relative: 34 %
Lymphs Abs: 1.8 10*3/uL (ref 1.0–3.6)
MCH: 21.7 pg — ABNORMAL LOW (ref 26.0–34.0)
MCHC: 32.5 g/dL (ref 32.0–36.0)
MCV: 66.8 fL — ABNORMAL LOW (ref 80.0–100.0)
MONOS PCT: 7 %
Monocytes Absolute: 0.4 10*3/uL (ref 0.2–0.9)
NEUTROS ABS: 3.1 10*3/uL (ref 1.4–6.5)
NEUTROS PCT: 55 %
Platelets: 192 10*3/uL (ref 150–440)
RBC: 4.91 MIL/uL (ref 3.80–5.20)
RDW: 17.3 % — ABNORMAL HIGH (ref 11.5–14.5)
WBC: 5.5 10*3/uL (ref 3.6–11.0)

## 2016-10-03 LAB — BASIC METABOLIC PANEL
ANION GAP: 7 (ref 5–15)
BUN: 9 mg/dL (ref 6–20)
CALCIUM: 8.3 mg/dL — AB (ref 8.9–10.3)
CHLORIDE: 109 mmol/L (ref 101–111)
CO2: 25 mmol/L (ref 22–32)
Creatinine, Ser: 0.68 mg/dL (ref 0.44–1.00)
GFR calc non Af Amer: >60 mL/min (ref >60)
Glucose, Bld: 129 mg/dL — ABNORMAL HIGH (ref 65–99)
Potassium: 3.3 mmol/L — ABNORMAL LOW (ref 3.5–5.1)
Sodium: 141 mmol/L (ref 135–145)

## 2016-10-03 SURGERY — COLECTOMY, WITH COLOSTOMY CREATION
Anesthesia: General

## 2016-10-03 MED ORDER — PNEUMOCOCCAL VAC POLYVALENT 25 MCG/0.5ML IJ INJ: 0.5000 mL | INJECTION | INTRAMUSCULAR | Status: DC

## 2016-10-03 MED ORDER — FLEET ENEMA 7-19 GM/118ML RE ENEM
1.0000 | ENEMA | Freq: Once | RECTAL | Status: AC
  Administered 2016-10-03: 1 via RECTAL

## 2016-10-03 NOTE — Progress Notes (Signed)
CC: History of intussusception Subjective:  Patient reports significant improvement. No nausea no vomiting she is hungry this morning. No abdominal pain. Last time she had an abdominal pain was last night. I have personally reviewed her CT scan done yesterday and also compared to the KUB this morning. A KUB this morning is normal. His CT scan show some questionable intussusception more importantly there is no evidence of mass in the mesentery or in the small bowel and there is no evidence of bowel obstruction. No free air. Her white count and her creatinine were normal Case d/w DR. DAVIS IN detail  Objective: Vital signs in last 24 hours: Temp:  [98 F (36.7 C)-98.5 F (36.9 C)] 98 F (36.7 C) (05/28 0424) Pulse Rate:  [47-78] 51 (05/28 0424) Resp:  [16-18] 18 (05/28 0424) BP: (98-142)/(66-97) 142/80 (05/28 0424) SpO2:  [95 %-100 %] 100 % (05/28 0424) Weight:  [79.9 kg (176 lb 1.6 oz)-80.7 kg (178 lb)] 79.9 kg (176 lb 1.6 oz) (05/28 0102) Last BM Date: 10/03/16  Intake/Output from previous day: 05/27 0701 - 05/28 0700 In: 500 [IV Piggyback:500] Out: -  Intake/Output this shift: Total I/O In: 760 [P.O.:760] Out: -   Physical exam: NAD, awake and alert Abd: soft, Nt, no peritonitis, no masses Ext: no edema, well perfused and warm Neuro: awake, GCS 15, no motor or sensory deficits.   Lab Results: CBC   Recent Labs  10/02/16 1631 10/03/16 0351  WBC 9.4 5.5  HGB 12.5 10.7*  HCT 39.4 32.8*  PLT 225 192   BMET  Recent Labs  10/02/16 1631 10/03/16 0351  NA 142 141  K 3.5 3.3*  CL 106 109  CO2 26 25  GLUCOSE 98 129*  BUN 8 9  CREATININE 0.68 0.68  CALCIUM 9.6 8.3*   PT/INR No results for input(s): LABPROT, INR in the last 72 hours. ABG No results for input(s): PHART, HCO3 in the last 72 hours.  Invalid input(s): PCO2, PO2  Studies/Results: Ct Abdomen Pelvis W Contrast  Result Date: 10/02/2016 CLINICAL DATA:  RIGHT lower abdominal pain and pressure in  pelvis increasing for the past few days, feels like she has to push really hard for bowel movements, worsened pain from sitting to standing, history smoking EXAM: CT ABDOMEN AND PELVIS WITH CONTRAST TECHNIQUE: Multidetector CT imaging of the abdomen and pelvis was performed using the standard protocol following bolus administration of intravenous contrast. Sagittal and coronal MPR images reconstructed from axial data set. CONTRAST:  142m ISOVUE-300 IOPAMIDOL (ISOVUE-300) INJECTION 61% IV. Dilute oral contrast. COMPARISON:  09/07/2012 FINDINGS: Lower chest: Subpleural nodule RIGHT middle lobe 5 mm diameter image 1 unchanged. Remaining lung bases clear. Hepatobiliary: Gallbladder and liver normal appearance Pancreas: Normal appearance Spleen: Normal appearance Adrenals/Urinary Tract: Adrenal glands normal appearance. Kidneys, ureters, and bladder normal appearance Stomach/Bowel: Normal appendix. Stomach well distended and normal appearance. Colon normal appearance. Wall thickening of distal ileum with a short segment of ileo-ileal intussusception approximately 3 cm length and the distal ileum. No definite mass lesion lead point is delineated. Remaining small bowel loops unremarkable without evidence of obstruction. Vascular/Lymphatic: Vascular structures unremarkable. Few normal size mesenteric lymph nodes in RIGHT mid abdomen. Reproductive: Unremarkable uterus and adnexa Other: No free air or free fluid. Musculoskeletal: Osseous structures unremarkable. IMPRESSION: Wall thickening of the distal ileum with a short segment of ileo-ileal intussusception approximately 3 cm in length without definite evidence of bowel obstruction. Stable 5 mm subpleural nodule RIGHT middle lobe since 2014. No additional intra-abdominal or intrapelvic  abnormalities. Electronically Signed   By: Lavonia Dana M.D.   On: 10/02/2016 20:01   Dg Abd Acute W/chest  Result Date: 10/03/2016 CLINICAL DATA:  Abdominal pain for several days EXAM:  DG ABDOMEN ACUTE W/ 1V CHEST COMPARISON:  10/02/2016 FINDINGS: Cardiac shadow is within normal limits. No focal infiltrate or sizable effusion is seen. Scattered large and small bowel gas is noted. No obstructive changes are noted. Contrast material is noted in the collecting systems from the recent CT examination. No bony abnormality is noted. IMPRESSION: No acute abnormality seen. Electronically Signed   By: Inez Catalina M.D.   On: 10/03/2016 08:14    Anti-infectives: Anti-infectives    None      Assessment/Plan:Resolve intussusception. Currently patient is pain free she is hungry. We will advance her diet to a clear liquid diet and tolerated a regular diet. No need for surgical intervention at this time. I think that she will need further imaging of her small bowel in the form of a CT enterography or small ball bowel follow-through to rule out any potential malignancy of the small bowel. The further imaging can deftly be on in the outpatient basis. If she does okay we will probably discharge her in the morning     Caroleen Hamman, MD, [facility]  10/03/2016

## 2016-10-03 NOTE — Progress Notes (Signed)
Patient requested to disconnect IV so she can go down and smoke. When notified  MD, MD said we can offer her a nicotine patch if needed. Patient declined nicotine patch and left AMA. MD was aware that patient left AMA.

## 2016-10-03 NOTE — ED Notes (Addendum)
Report called from now until [place] called to update for room and POC

## 2016-10-03 NOTE — Progress Notes (Signed)
Passing bloody sediments x 2. MD notified.

## 2016-10-04 LAB — HIV ANTIBODY (ROUTINE TESTING W REFLEX): HIV Screen 4th Generation wRfx: NONREACTIVE

## 2016-10-27 DIAGNOSIS — R1031 Right lower quadrant pain: Secondary | ICD-10-CM

## 2017-08-25 ENCOUNTER — Other Ambulatory Visit: Payer: Self-pay

## 2017-08-25 ENCOUNTER — Inpatient Hospital Stay: Payer: Managed Care, Other (non HMO)

## 2017-08-25 ENCOUNTER — Emergency Department: Payer: Managed Care, Other (non HMO)

## 2017-08-25 ENCOUNTER — Inpatient Hospital Stay
Admission: EM | Admit: 2017-08-25 | Discharge: 2017-08-28 | DRG: 390 | Disposition: A | Discharge status: Home / Self Care | Payer: Managed Care, Other (non HMO) | Attending: Internal Medicine | Admitting: Internal Medicine

## 2017-08-25 DIAGNOSIS — K5730 Diverticulosis of large intestine without perforation or abscess without bleeding: Secondary | ICD-10-CM | POA: Diagnosis present

## 2017-08-25 DIAGNOSIS — D509 Iron deficiency anemia, unspecified: Secondary | ICD-10-CM | POA: Diagnosis not present

## 2017-08-25 DIAGNOSIS — F17200 Nicotine dependence, unspecified, uncomplicated: Secondary | ICD-10-CM | POA: Diagnosis present

## 2017-08-25 DIAGNOSIS — K561 Intussusception: Secondary | ICD-10-CM | POA: Diagnosis present

## 2017-08-25 DIAGNOSIS — Z4659 Encounter for fitting and adjustment of other gastrointestinal appliance and device: Secondary | ICD-10-CM

## 2017-08-25 DIAGNOSIS — K56600 Partial intestinal obstruction, unspecified as to cause: Secondary | ICD-10-CM | POA: Diagnosis present

## 2017-08-25 DIAGNOSIS — K50012 Crohn's disease of small intestine with intestinal obstruction: Secondary | ICD-10-CM | POA: Diagnosis not present

## 2017-08-25 DIAGNOSIS — R109 Unspecified abdominal pain: Secondary | ICD-10-CM | POA: Diagnosis present

## 2017-08-25 LAB — CBC
HEMATOCRIT: 36.2 % (ref 35.0–47.0)
HEMATOCRIT: 33.9 % — AB (ref 35.0–47.0)
HEMOGLOBIN: 11.7 g/dL — AB (ref 12.0–16.0)
Hemoglobin: 11.1 g/dL — ABNORMAL LOW (ref 12.0–16.0)
MCH: 20.8 pg — ABNORMAL LOW (ref 26.0–34.0)
MCH: 20.7 pg — AB (ref 26.0–34.0)
MCHC: 32.4 g/dL (ref 32.0–36.0)
MCHC: 32.7 g/dL (ref 32.0–36.0)
MCV: 64.1 fL — ABNORMAL LOW (ref 80.0–100.0)
MCV: 63.2 fL — AB (ref 80.0–100.0)
Platelets: 260 10*3/uL (ref 150–440)
Platelets: 193 10*3/uL (ref 150–440)
RBC: 5.64 MIL/uL — AB (ref 3.80–5.20)
RBC: 5.37 MIL/uL — ABNORMAL HIGH (ref 3.80–5.20)
RDW: 15.8 % — ABNORMAL HIGH (ref 11.5–14.5)
RDW: 16 % — AB (ref 11.5–14.5)
WBC: 12.1 10*3/uL — AB (ref 3.6–11.0)
WBC: 19.2 10*3/uL — AB (ref 3.6–11.0)

## 2017-08-25 LAB — COMPREHENSIVE METABOLIC PANEL
ALK PHOS: 145 U/L — AB (ref 38–126)
ALT: 15 U/L (ref 14–54)
AST: 18 U/L (ref 15–41)
Albumin: 3.8 g/dL (ref 3.5–5.0)
Anion gap: 9 (ref 5–15)
BUN: 13 mg/dL (ref 6–20)
CHLORIDE: 108 mmol/L (ref 101–111)
CO2: 22 mmol/L (ref 22–32)
Calcium: 8.9 mg/dL (ref 8.9–10.3)
Creatinine, Ser: 0.55 mg/dL (ref 0.44–1.00)
GFR calc Af Amer: >60 mL/min (ref >60)
GFR calc non Af Amer: >60 mL/min (ref >60)
GLUCOSE: 114 mg/dL — AB (ref 65–99)
Potassium: 4.1 mmol/L (ref 3.5–5.1)
SODIUM: 139 mmol/L (ref 135–145)
Total Bilirubin: 0.9 mg/dL (ref 0.3–1.2)
Total Protein: 7.5 g/dL (ref 6.5–8.1)

## 2017-08-25 LAB — CREATININE, SERUM
Creatinine, Ser: 0.59 mg/dL (ref 0.44–1.00)
GFR calc non Af Amer: >60 mL/min (ref >60)

## 2017-08-25 LAB — C-REACTIVE PROTEIN: CRP: 3.1 mg/dL — AB (ref <1.0)

## 2017-08-25 LAB — SEDIMENTATION RATE: SED RATE: 22 mm/h — AB (ref 0–20)

## 2017-08-25 LAB — LIPASE, BLOOD: LIPASE: 19 U/L (ref 11–51)

## 2017-08-25 MED ORDER — CIPROFLOXACIN IN D5W 400 MG/200ML IV SOLN
400.0000 mg | Freq: Two times a day (BID) | INTRAVENOUS | Status: DC
  Administered 2017-08-25 – 2017-08-26 (×3): 400 mg via INTRAVENOUS
  Filled 2017-08-25 (×3): qty 200

## 2017-08-25 MED ORDER — CIPROFLOXACIN IN D5W 400 MG/200ML IV SOLN: 400.0000 mg | Freq: Once | INTRAVENOUS | Status: DC

## 2017-08-25 MED ORDER — MORPHINE SULFATE (PF) 2 MG/ML IV SOLN
2.0000 mg | INTRAVENOUS | Status: DC | PRN
  Administered 2017-08-25 – 2017-08-26 (×5): 2 mg via INTRAVENOUS
  Filled 2017-08-25 (×5): qty 1

## 2017-08-25 MED ORDER — METRONIDAZOLE IN NACL 5-0.79 MG/ML-% IV SOLN: 500.0000 mg | Freq: Once | INTRAVENOUS | Status: DC

## 2017-08-25 MED ORDER — ENOXAPARIN SODIUM 40 MG/0.4ML ~~LOC~~ SOLN
40.0000 mg | SUBCUTANEOUS | Status: DC
  Administered 2017-08-25 – 2017-08-27 (×3): 40 mg via SUBCUTANEOUS
  Filled 2017-08-25 (×3): qty 0.4

## 2017-08-25 MED ORDER — HYDROCODONE-ACETAMINOPHEN 5-325 MG PO TABS
1.0000 | ORAL_TABLET | ORAL | Status: DC | PRN
  Administered 2017-08-28: 2 via ORAL
  Filled 2017-08-25: qty 2

## 2017-08-25 MED ORDER — ONDANSETRON HCL 4 MG/2ML IJ SOLN
4.0000 mg | Freq: Four times a day (QID) | INTRAMUSCULAR | Status: DC | PRN
Start: 2017-08-25 — End: 2017-08-28
  Administered 2017-08-26 – 2017-08-28 (×2): 4 mg via INTRAVENOUS
  Filled 2017-08-25 (×2): qty 2

## 2017-08-25 MED ORDER — IOPAMIDOL (ISOVUE-300) INJECTION 61%
100.0000 mL | Freq: Once | INTRAVENOUS | Status: AC | PRN
  Administered 2017-08-25: 100 mL via INTRAVENOUS

## 2017-08-25 MED ORDER — ONDANSETRON HCL 4 MG/2ML IJ SOLN
4.0000 mg | Freq: Once | INTRAMUSCULAR | Status: DC | PRN
  Filled 2017-08-25: qty 2

## 2017-08-25 MED ORDER — ACETAMINOPHEN 650 MG RE SUPP: 650.0000 mg | Freq: Four times a day (QID) | RECTAL | Status: DC | PRN

## 2017-08-25 MED ORDER — ONDANSETRON HCL 4 MG PO TABS: 4.0000 mg | ORAL_TABLET | Freq: Four times a day (QID) | ORAL | Status: DC | PRN

## 2017-08-25 MED ORDER — DEXTROSE-NACL 5-0.45 % IV SOLN
INTRAVENOUS | Status: DC
  Administered 2017-08-25 – 2017-08-28 (×6): via INTRAVENOUS

## 2017-08-25 MED ORDER — ACETAMINOPHEN 325 MG PO TABS
650.0000 mg | ORAL_TABLET | Freq: Four times a day (QID) | ORAL | Status: DC | PRN
  Administered 2017-08-28: 13:00:00 650 mg via ORAL
  Filled 2017-08-25: qty 2

## 2017-08-25 MED ORDER — CIPROFLOXACIN IN D5W 400 MG/200ML IV SOLN
400.0000 mg | Freq: Two times a day (BID) | INTRAVENOUS | Status: DC
  Filled 2017-08-25 (×2): qty 200

## 2017-08-25 MED ORDER — SODIUM CHLORIDE 0.9 % IV SOLN
8.0000 mg | Freq: Once | INTRAVENOUS | Status: AC
  Administered 2017-08-25: 8 mg via INTRAVENOUS
  Filled 2017-08-25: qty 4

## 2017-08-25 MED ORDER — METRONIDAZOLE IN NACL 5-0.79 MG/ML-% IV SOLN
500.0000 mg | Freq: Three times a day (TID) | INTRAVENOUS | Status: DC
  Administered 2017-08-25 – 2017-08-26 (×4): 500 mg via INTRAVENOUS
  Filled 2017-08-25 (×5): qty 100

## 2017-08-25 NOTE — ED Notes (Signed)
First Nurse Note:  Patient to Room 1, Dr. Owens Shark and Radonna Ricker RN aware of placement.

## 2017-08-25 NOTE — H&P (Signed)
[facility] NAME: Katelyn Garrett    MR#:  [id number]  DATE OF BIRTH:  June 22, 1965  DATE OF ADMISSION:  08/25/2017  PRIMARY CARE PHYSICIAN: Patient, No Pcp Per   REQUESTING/REFERRING PHYSICIAN: Marjean Donna  CHIEF COMPLAINT:   Chief Complaint  Patient presents with  . Abdominal Pain  . Emesis    HISTORY OF PRESENT ILLNESS: Katelyn Garrett  is a 52 y.o. female with a known history of ileo-ileal intussusception in 2018 presenting with abdominal pain ongoing for the past 3 days.  Patient states that she has generalized abdominal pain nausea vomiting and constipation for 3 days.  Progressively worse.  She has not had any diarrhea.  She has not had any fevers.  She denies any bloody stools.  Patient not eating or drinking much over the last 3 days.  CT scan shows ileitis    PAST MEDICAL HISTORY:   Past Medical History:  Diagnosis Date  . Chronic constipation     PAST SURGICAL HISTORY:  Past Surgical History:  Procedure Laterality Date  . TUBAL LIGATION      SOCIAL HISTORY:  Social History   Tobacco Use  . Smoking status: Current Every Day Smoker  . Smokeless tobacco: Never Used  Substance Use Topics  . Alcohol use: No    FAMILY HISTORY: No family history on file.  DRUG ALLERGIES: No Known Allergies  REVIEW OF SYSTEMS:   CONSTITUTIONAL: No fever, fatigue or weakness.  EYES: No blurred or double vision.  EARS, NOSE, AND THROAT: No tinnitus or ear pain.  RESPIRATORY: No cough, shortness of breath, wheezing or hemoptysis.  CARDIOVASCULAR: No chest pain, orthopnea, edema.  GASTROINTESTINAL: Positive nausea, positive vomiting,no diarrhea or positive abdominal pain.  GENITOURINARY: No dysuria, hematuria.  ENDOCRINE: No polyuria, nocturia,  HEMATOLOGY: No anemia, easy bruising or bleeding SKIN: No rash or lesion. MUSCULOSKELETAL: No joint pain or arthritis.   NEUROLOGIC: No tingling, numbness, weakness.  PSYCHIATRY: No  anxiety or depression.   MEDICATIONS AT HOME:  Prior to Admission medications   Not on File      PHYSICAL EXAMINATION:   VITAL SIGNS: Blood pressure 117/63, pulse 71, temperature 98.2 F (36.8 C), temperature source Oral, resp. rate 20, height 5' 7"  (1.702 m), weight 73.9 kg (163 lb), SpO2 93 %.  GENERAL:  52 y.o.-year-old patient lying in the bed with no acute distress.  EYES: Pupils equal, round, reactive to light and accommodation. No scleral icterus. Extraocular muscles intact.  HEENT: Head atraumatic, normocephalic. Oropharynx and nasopharynx clear.  NECK:  Supple, no jugular venous distention. No thyroid enlargement, no tenderness.  LUNGS: Normal breath sounds bilaterally, no wheezing, rales,rhonchi or crepitation. No use of accessory muscles of respiration.  CARDIOVASCULAR: S1, S2 normal. No murmurs, rubs, or gallops.  ABDOMEN: Soft, generalized tenderness, nondistended. Bowel sounds present. No organomegaly or mass.  EXTREMITIES: No pedal edema, cyanosis, or clubbing.  NEUROLOGIC: Cranial nerves II through XII are intact. Muscle strength 5/5 in all extremities. Sensation intact. Gait not checked.  PSYCHIATRIC: The patient is alert and oriented x 3.  SKIN: No obvious rash, lesion, or ulcer.   LABORATORY PANEL:   CBC Recent Labs  Lab 08/25/17 0735  WBC 12.1*  HGB 11.7*  HCT 36.2  PLT 260  MCV 64.1*  MCH 20.8*  MCHC 32.4  RDW 15.8*   ------------------------------------------------------------------------------------------------------------------  Chemistries  Recent Labs  Lab 08/25/17 0735  NA 139  K 4.1  CL 108  CO2 22  GLUCOSE 114*  BUN 13  CREATININE 0.55  CALCIUM 8.9  AST 18  ALT 15  ALKPHOS 145*  BILITOT 0.9   ------------------------------------------------------------------------------------------------------------------ estimated creatinine clearance is 82.7 mL/min (by C-G formula based on SCr of 0.55  mg/dL). ------------------------------------------------------------------------------------------------------------------ No results for input(s): TSH, T4TOTAL, T3FREE, THYROIDAB in the last 72 hours.  Invalid input(s): FREET3   Coagulation profile No results for input(s): INR, PROTIME in the last 168 hours. ------------------------------------------------------------------------------------------------------------------- No results for input(s): DDIMER in the last 72 hours. -------------------------------------------------------------------------------------------------------------------  Cardiac Enzymes No results for input(s): CKMB, TROPONINI, MYOGLOBIN in the last 168 hours.  Invalid input(s): CK ------------------------------------------------------------------------------------------------------------------ Invalid input(s): POCBNP  ---------------------------------------------------------------------------------------------------------------  Urinalysis    Component Value Date/Time   COLORURINE YELLOW (A) 10/02/2016 1631   APPEARANCEUR CLEAR (A) 10/02/2016 1631   APPEARANCEUR Cloudy 09/07/2012 1142   LABSPEC 1.019 10/02/2016 1631   LABSPEC 1.025 09/07/2012 1142   PHURINE 5.0 10/02/2016 1631   GLUCOSEU NEGATIVE 10/02/2016 1631   GLUCOSEU Negative 09/07/2012 1142   HGBUR MODERATE (A) 10/02/2016 1631   BILIRUBINUR NEGATIVE 10/02/2016 1631   BILIRUBINUR Negative 09/07/2012 1142   KETONESUR 5 (A) 10/02/2016 1631   PROTEINUR NEGATIVE 10/02/2016 1631   NITRITE NEGATIVE 10/02/2016 1631   LEUKOCYTESUR NEGATIVE 10/02/2016 1631   LEUKOCYTESUR 1+ 09/07/2012 1142     RADIOLOGY: Ct Abdomen Pelvis W Contrast  Result Date: 08/25/2017 CLINICAL DATA:  Abdominal distention. EXAM: CT ABDOMEN AND PELVIS WITH CONTRAST TECHNIQUE: Multidetector CT imaging of the abdomen and pelvis was performed using the standard protocol following bolus administration of intravenous contrast.  CONTRAST:  162m ISOVUE-300 IOPAMIDOL (ISOVUE-300) INJECTION 61% COMPARISON:  10/02/2016 FINDINGS: Lower chest: No acute abnormality. Hepatobiliary: No focal liver abnormality is seen. No gallstones, gallbladder wall thickening, or biliary dilatation. Pancreas: Unremarkable. No pancreatic ductal dilatation or surrounding inflammatory changes. Spleen: Normal in size without focal abnormality. Adrenals/Urinary Tract: The adrenal glands are normal. Small hypodensity within the interpolar left kidney measures 6 mm and is too small to characterize. Similarly there is a small low density structure in the upper pole of right kidney measuring 5 mm. No mass or hydronephrosis identified urinary bladder is negative. Stomach/Bowel: Small hiatal hernia. The proximal small bowel loops are unremarkable. The mid and distal small bowel loops are abnormal in appearance. Loops measure up to 3.3 cm. There is marked wall thickening and inflammation involving the distal aspect of the terminal ileum with wall thickness measuring up to 1.3 cm. Here, there is significantly decreased caliber of the bowel lumen. Proximal to this there is abnormal increase caliber of the small bowel loops. The colon has a normal caliber. No significant wall thickening or inflammation involving the colon. Vascular/Lymphatic: Normal appearance of the abdominal aorta. No enlarged abdominal lymph nodes. Small bowel mesenteric node is prominent measuring 9 mm, likely reactive. Right lower quadrant ileocolic lymph nodes are prominent measuring up to 9 mm, image 53/2. Likely reactive. No iliac or inguinal adenopathy. Reproductive: Uterus and bilateral adnexa are unremarkable. Other: There is a moderate volume of free fluid identified within the pelvis. No discrete abscess identified. Musculoskeletal: Mild degenerative disc disease within the lower thoracic and lumbar spine. No suspicious bone lesions. IMPRESSION: 1. There is abnormal wall thickening and inflammation  involving the terminal ileum compatible with ileitis. Likely inflammatory or infectious ileitis. Inflammatory bowel disease such as Crohn's disease is not excluded. Moderate free fluid noted within the pelvis, but no definite evidence for abscess or penetrating disease at this time. 2. Proximal to thickened and inflamed terminal ileum  the mid and distal small bowel loops are increased in caliber compatible with at least a partial small bowel obstruction. 3. Small hiatal hernia. Electronically Signed   By: Kerby Moors M.D.   On: 08/25/2017 09:08    EKG: Orders placed or performed during the hospital encounter of 02/05/16  . EKG 12-Lead  . EKG 12-Lead  . ED EKG within 10 minutes  . ED EKG within 10 minutes  . EKG  . EKG    IMPRESSION AND PLAN: Patient is 52 year old African-American female presenting with generalized abdominal pain  1.  Generalized abdominal pain due to terminal ileitis I will ask GI to see Place on IV antibiotic Check CRP and ESR  2.  Chronic constipation will hold off on any medications for now  3.  Nicotine abuse smoking cessation provided 4 minutes spent patient recommended to stop smoking start nicotine patch      All the records are reviewed and case discussed with ED provider. Management plans discussed with the patient, family and they are in agreement.  CODE STATUS: Code Status History    Date Active Date Inactive Code Status Order ID Comments User Context   10/02/2016 2333 10/03/2016 2141 Full Code [id number]  Vickie Epley, MD ED       TOTAL TIME TAKING CARE OF THIS PATIENT: 55 minutes.    Dustin Flock M.D on 08/25/2017 at 9:46 AM  Between 7am to 6pm - Pager - [phone]  After 6pm go to [email or web address] - password [email or web address] [facility]  [employer] [facility]  [phone]  CC: Primary care physician; Patient, No Pcp Per

## 2017-08-25 NOTE — ED Notes (Signed)
Taken to floor by Ed [employer].

## 2017-08-25 NOTE — ED Triage Notes (Signed)
Pt is diaphoretic, husband reports no BM in 4 days, hasn't eaten or been able to drink, fearful that the obstruction could be at the level of surgery at this point

## 2017-08-25 NOTE — ED Notes (Signed)
ED Provider at bedside. 

## 2017-08-25 NOTE — ED Notes (Signed)
Report to [place], [place]

## 2017-08-25 NOTE — ED Provider Notes (Signed)
[facility] Emergency Department Provider Note    First MD Initiated Contact with Patient 08/25/17 [phone]     (approximate)  I have reviewed the triage vital signs and the nursing notes.   HISTORY  Chief Complaint Abdominal Pain and Emesis    HPI Katelyn Garrett is a 52 y.o. female with bolus of previous medical conditions presents to the emergency department with generalized abdominal pain nausea vomiting constipation 3 days. Patient states current pain score is 10 out of 10 and generalized. Patient denies any fever. Patient denies any urinary symptoms.   Past Medical History:  Diagnosis Date  . Chronic constipation     Patient Active Problem List   Diagnosis Date Noted  . Right lower quadrant abdominal pain   . Intussusception intestine ([place]) 10/02/2016    Past Surgical History:  Procedure Laterality Date  . TUBAL LIGATION      Prior to Admission medications   Not on File    Allergies no known drug allergies No family history on file.  Social History Social History   Tobacco Use  . Smoking status: Current Every Day Smoker  . Smokeless tobacco: Never Used  Substance Use Topics  . Alcohol use: No  . Drug use: Not on file    Review of Systems Constitutional: No fever/chills Eyes: No visual changes. ENT: No sore throat. Cardiovascular: Denies chest pain. Respiratory: Denies shortness of breath. Gastrointestinal: positive for generalized abdominal pain nausea vomiting Genitourinary: Negative for dysuria. Musculoskeletal: Negative for neck pain.  Negative for back pain. Integumentary: Negative for rash. Neurological: Negative for headaches, focal weakness or numbness.   ____________________________________________   PHYSICAL EXAM:  VITAL SIGNS: ED Triage Vitals  Enc Vitals Group     BP 08/25/17 0733 (!) 145/81     Pulse Rate 08/25/17 0733 (!) 53     Resp 08/25/17 0733 (!) 22     Temp 08/25/17 0733 98.2 F (36.8 C)       Temp Source 08/25/17 0733 Oral     SpO2 08/25/17 0733 100 %     Weight 08/25/17 0734 73.9 kg (163 lb)     Height 08/25/17 0734 1.702 m (5' 7" )     Head Circumference --      Peak Flow --      Pain Score 08/25/17 0733 10     Pain Loc --      Pain Edu? --      Excl. in [place]? --     Constitutional: Alert and oriented.apparent discomfort Eyes: Conjunctivae are normal. PERRL. EOMI. Head: Atraumatic. Mouth/Throat: Mucous membranes are moist.  Oropharynx non-erythematous. Neck: No stridor.  Cardiovascular: Normal rate, regular rhythm. Good peripheral circulation. Grossly normal heart sounds. Respiratory: Normal respiratory effort.  No retractions. Lungs CTAB. Gastrointestinal:Generalized tenderness to palpation..  Musculoskeletal: No lower extremity tenderness nor edema. No gross deformities of extremities. Neurologic:  Normal speech and language. No gross focal neurologic deficits are appreciated.  Skin:  Skin is warm, dry and intact. No rash noted. Psychiatric: Mood and affect are normal. Speech and behavior are normal.  ____________________________________________   LABS (all labs ordered are listed, but only abnormal results are displayed)  Labs Reviewed  COMPREHENSIVE METABOLIC PANEL - Abnormal; Notable for the following components:      Result Value   Glucose, Bld 114 (*)    Alkaline Phosphatase 145 (*)    All other components within normal limits  CBC - Abnormal; Notable for the following components:   WBC  12.1 (*)    RBC 5.64 (*)    Hemoglobin 11.7 (*)    MCV 64.1 (*)    MCH 20.8 (*)    RDW 15.8 (*)    All other components within normal limits  LIPASE, BLOOD  URINALYSIS, COMPLETE (UACMP) WITH MICROSCOPIC     RADIOLOGY I, Weatherby N Yasir Kitner, personally viewed and evaluated these images (plain radiographs) as part of my medical decision making, as well as reviewing the written report by the radiologist.  ED MD interpretation:  terminal ileum inflammation with  partial small bowel obstruction per radiologist.  Official radiology report(s): Ct Abdomen Pelvis W Contrast  Result Date: 08/25/2017 CLINICAL DATA:  Abdominal distention. EXAM: CT ABDOMEN AND PELVIS WITH CONTRAST TECHNIQUE: Multidetector CT imaging of the abdomen and pelvis was performed using the standard protocol following bolus administration of intravenous contrast. CONTRAST:  182m ISOVUE-300 IOPAMIDOL (ISOVUE-300) INJECTION 61% COMPARISON:  10/02/2016 FINDINGS: Lower chest: No acute abnormality. Hepatobiliary: No focal liver abnormality is seen. No gallstones, gallbladder wall thickening, or biliary dilatation. Pancreas: Unremarkable. No pancreatic ductal dilatation or surrounding inflammatory changes. Spleen: Normal in size without focal abnormality. Adrenals/Urinary Tract: The adrenal glands are normal. Small hypodensity within the interpolar left kidney measures 6 mm and is too small to characterize. Similarly there is a small low density structure in the upper pole of right kidney measuring 5 mm. No mass or hydronephrosis identified urinary bladder is negative. Stomach/Bowel: Small hiatal hernia. The proximal small bowel loops are unremarkable. The mid and distal small bowel loops are abnormal in appearance. Loops measure up to 3.3 cm. There is marked wall thickening and inflammation involving the distal aspect of the terminal ileum with wall thickness measuring up to 1.3 cm. Here, there is significantly decreased caliber of the bowel lumen. Proximal to this there is abnormal increase caliber of the small bowel loops. The colon has a normal caliber. No significant wall thickening or inflammation involving the colon. Vascular/Lymphatic: Normal appearance of the abdominal aorta. No enlarged abdominal lymph nodes. Small bowel mesenteric node is prominent measuring 9 mm, likely reactive. Right lower quadrant ileocolic lymph nodes are prominent measuring up to 9 mm, image 53/2. Likely reactive. No iliac  or inguinal adenopathy. Reproductive: Uterus and bilateral adnexa are unremarkable. Other: There is a moderate volume of free fluid identified within the pelvis. No discrete abscess identified. Musculoskeletal: Mild degenerative disc disease within the lower thoracic and lumbar spine. No suspicious bone lesions. IMPRESSION: 1. There is abnormal wall thickening and inflammation involving the terminal ileum compatible with ileitis. Likely inflammatory or infectious ileitis. Inflammatory bowel disease such as Crohn's disease is not excluded. Moderate free fluid noted within the pelvis, but no definite evidence for abscess or penetrating disease at this time. 2. Proximal to thickened and inflamed terminal ileum the mid and distal small bowel loops are increased in caliber compatible with at least a partial small bowel obstruction. 3. Small hiatal hernia. Electronically Signed   By: TKerby MoorsM.D.   On: 08/25/2017 09:08     NG placement Date/Time: 08/25/2017 10:41 AM Performed by: BGregor Hams MD Authorized by: BGregor Hams MD  Consent: Verbal consent obtained. Written consent not obtained. Consent given by: patient Imaging studies: imaging studies available Patient identity confirmed: arm band, provided demographic data and verbally with patient Preparation: Patient was prepped and draped in the usual sterile fashion. Local anesthesia used: no  Anesthesia: Local anesthesia used: no  Sedation: Patient sedated: no  Patient tolerance: Patient tolerated the  procedure well with no immediate complications      ____________________________________________   INITIAL IMPRESSION / ASSESSMENT AND PLAN / ED COURSE  As part of my medical decision making, I reviewed the following data within the electronic MEDICAL RECORD NUMBER 52 year old female presenting with above stated history of physical exam with concern for possible bowel obstruction versus potential other intra-abdominal  pathology including appendicitis diverticulitis etc. Patient given IV Zofran 8 mg, , patient also given IV morphine 2 mg as well as IV normal saline 2 L. CT scan consistent with terminal ileitis with partial small bowel obstruction. Patient discussed with Dr. Dorthula Rue hospital admission.. Patient also discussed with Dr. Rosana Hoes general surgeon on call. NG tube was inserted.____________________________________________  FINAL CLINICAL IMPRESSION(S) / ED DIAGNOSES  Final diagnoses:  Partial small bowel obstruction (HCC)  Terminal ileitis, with intestinal obstruction ([place])     MEDICATIONS GIVEN DURING THIS VISIT:  Medications  ondansetron (ZOFRAN) 8 mg in sodium chloride 0.9 % 50 mL IVPB (0 mg Intravenous Stopped 08/25/17 0932)  iopamidol (ISOVUE-300) 61 % injection 100 mL (100 mLs Intravenous Contrast Given 08/25/17 6147)     ED Discharge Orders    None       Note:  This document was prepared using Dragon voice recognition software and may include unintentional dictation errors.    Gregor Hams, MD 08/25/17 1043

## 2017-08-25 NOTE — ED Triage Notes (Signed)
Pt states that she has been having abd pain for the past 4 days, pt actively vomiting in triage, pt reports hx of blockage in her intestines, pt asking"please help me"

## 2017-08-25 NOTE — ED Notes (Signed)
Patient transported to CT 

## 2017-08-26 ENCOUNTER — Inpatient Hospital Stay: Payer: Managed Care, Other (non HMO)

## 2017-08-26 DIAGNOSIS — D509 Iron deficiency anemia, unspecified: Secondary | ICD-10-CM

## 2017-08-26 DIAGNOSIS — K50012 Crohn's disease of small intestine with intestinal obstruction: Secondary | ICD-10-CM

## 2017-08-26 DIAGNOSIS — K56600 Partial intestinal obstruction, unspecified as to cause: Principal | ICD-10-CM

## 2017-08-26 LAB — URINALYSIS, COMPLETE (UACMP) WITH MICROSCOPIC
Bilirubin Urine: NEGATIVE
Glucose, UA: NEGATIVE mg/dL
Ketones, ur: 20 mg/dL — AB
LEUKOCYTES UA: NEGATIVE
NITRITE: NEGATIVE
PH: 8 (ref 5.0–8.0)
Protein, ur: 30 mg/dL — AB
SPECIFIC GRAVITY, URINE: 1.033 — AB (ref 1.005–1.030)

## 2017-08-26 LAB — BASIC METABOLIC PANEL
Anion gap: 5 (ref 5–15)
BUN: 9 mg/dL (ref 6–20)
CHLORIDE: 105 mmol/L (ref 101–111)
CO2: 27 mmol/L (ref 22–32)
CREATININE: 0.62 mg/dL (ref 0.44–1.00)
Calcium: 8.3 mg/dL — ABNORMAL LOW (ref 8.9–10.3)
GFR calc Af Amer: >60 mL/min (ref >60)
GFR calc non Af Amer: >60 mL/min (ref >60)
GLUCOSE: 113 mg/dL — AB (ref 65–99)
POTASSIUM: 3.5 mmol/L (ref 3.5–5.1)
SODIUM: 137 mmol/L (ref 135–145)

## 2017-08-26 LAB — CBC
HCT: 32.2 % — ABNORMAL LOW (ref 35.0–47.0)
HEMOGLOBIN: 10.5 g/dL — AB (ref 12.0–16.0)
MCH: 21 pg — AB (ref 26.0–34.0)
MCHC: 32.6 g/dL (ref 32.0–36.0)
MCV: 64.3 fL — ABNORMAL LOW (ref 80.0–100.0)
PLATELETS: 172 10*3/uL (ref 150–440)
RBC: 5.01 MIL/uL (ref 3.80–5.20)
RDW: 16.2 % — ABNORMAL HIGH (ref 11.5–14.5)
WBC: 11.8 10*3/uL — ABNORMAL HIGH (ref 3.6–11.0)

## 2017-08-26 LAB — VITAMIN B12: Vitamin B-12: 223 pg/mL (ref 180–914)

## 2017-08-26 LAB — FERRITIN: FERRITIN: 62 ng/mL (ref 11–307)

## 2017-08-26 LAB — IRON AND TIBC
Iron: 11 ug/dL — ABNORMAL LOW (ref 28–170)
SATURATION RATIOS: 4 % — AB (ref 10.4–31.8)
TIBC: 277 ug/dL (ref 250–450)
UIBC: 266 ug/dL

## 2017-08-26 LAB — FOLATE: FOLATE: 6 ng/mL (ref >5.9)

## 2017-08-26 MED ORDER — FAMOTIDINE IN NACL 20-0.9 MG/50ML-% IV SOLN
20.0000 mg | Freq: Two times a day (BID) | INTRAVENOUS | Status: DC
  Administered 2017-08-26 – 2017-08-28 (×5): 20 mg via INTRAVENOUS
  Filled 2017-08-26 (×5): qty 50

## 2017-08-26 MED ORDER — HYDROMORPHONE HCL 1 MG/ML IJ SOLN
2.0000 mg | INTRAMUSCULAR | Status: DC | PRN
  Administered 2017-08-26 (×2): 2 mg via INTRAVENOUS
  Filled 2017-08-26 (×3): qty 2

## 2017-08-26 MED ORDER — METHYLPREDNISOLONE SODIUM SUCC 125 MG IJ SOLR
60.0000 mg | INTRAMUSCULAR | Status: DC
  Administered 2017-08-26 – 2017-08-27 (×2): 60 mg via INTRAVENOUS
  Filled 2017-08-26 (×2): qty 2

## 2017-08-26 NOTE — Plan of Care (Signed)
  Problem: Education: Goal: Knowledge of General Education information will improve Outcome: Progressing   Problem: Health Behavior/Discharge Planning: Goal: Ability to manage health-related needs will improve Outcome: Progressing   Problem: Clinical Measurements: Goal: Ability to maintain clinical measurements within normal limits will improve Outcome: Progressing   Problem: Nutrition: Goal: Adequate nutrition will be maintained Outcome: Progressing   Problem: Elimination: Goal: Will not experience complications related to bowel motility Outcome: Progressing   Problem: Pain Managment: Goal: General experience of comfort will improve Outcome: Progressing   Problem: Safety: Goal: Ability to remain free from injury will improve Outcome: Progressing   Problem: Skin Integrity: Goal: Risk for impaired skin integrity will decrease Outcome: Progressing

## 2017-08-26 NOTE — Progress Notes (Signed)
[place] at [facility]                                                                                                                                                                                  Patient Demographics   Katelyn Garrett, is a 52 y.o. female, DOB - 11-15-1965, NGE:[id number]  Admit date - 08/25/2017   Admitting Physician Dustin Flock, MD  Outpatient Primary MD for the patient is Patient, No Pcp Per   LOS - 1  Subjective: Patient admitted with abdominal pain yesterday continues to have abdominal pain has NG tube in place and is having significant drainage.  Patient continues to complain of abdominal pain.  States that she is passing gas    Review of Systems:   CONSTITUTIONAL: No documented fever. No fatigue, weakness. No weight gain, no weight loss.  EYES: No blurry or double vision.  ENT: No tinnitus. No postnasal drip. No redness of the oropharynx.  RESPIRATORY: No cough, no wheeze, no hemoptysis. No dyspnea.  CARDIOVASCULAR: No chest pain. No orthopnea. No palpitations. No syncope.  GASTROINTESTINAL: Positive nausea, no vomiting or diarrhea.  Positive abdominal pain. No melena or hematochezia.  GENITOURINARY: No dysuria or hematuria.  ENDOCRINE: No polyuria or nocturia. No heat or cold intolerance.  HEMATOLOGY: No anemia. No bruising. No bleeding.  INTEGUMENTARY: No rashes. No lesions.  MUSCULOSKELETAL: No arthritis. No swelling. No gout.  NEUROLOGIC: No numbness, tingling, or ataxia. No seizure-type activity.  PSYCHIATRIC: No anxiety. No insomnia. No ADD.    Vitals:   Vitals:   08/25/17 1110 08/25/17 1300 08/25/17 1915 08/26/17 0425  BP: 112/73 107/86 121/73 126/70  Pulse: 64 68 85 (!) 59  Resp: 18 18 15 17   Temp: 97.9 F (36.6 C) 98.6 F (37 C) 99.1 F (37.3 C) 98.8 F (37.1 C)  TempSrc: Oral Oral Oral Oral  SpO2: 97%  96% 97%  Weight: 74.8 kg (164 lb 14.4 oz)     Height: 5' 7"  (1.702 m)       Wt Readings from Last 3  Encounters:  08/25/17 74.8 kg (164 lb 14.4 oz)  10/03/16 79.9 kg (176 lb 1.6 oz)  02/05/16 63.5 kg (140 lb)     Intake/Output Summary (Last 24 hours) at 08/26/2017 1254 Last data filed at 08/26/2017 0541 Gross per 24 hour  Intake 3000 ml  Output 600 ml  Net 2400 ml    Physical Exam:   GENERAL: Pleasant-appearing in no apparent distress.  HEAD, EYES, EARS, NOSE AND THROAT: Atraumatic, normocephalic. Extraocular muscles are intact. Pupils equal and reactive to light. Sclerae anicteric. No conjunctival injection. No oro-pharyngeal erythema.  NECK: Supple. There is no jugular  venous distention. No bruits, no lymphadenopathy, no thyromegaly.  HEART: Regular rate and rhythm,. No murmurs, no rubs, no clicks.  LUNGS: Clear to auscultation bilaterally. No rales or rhonchi. No wheezes.  ABDOMEN: Soft, flat, nontender, nondistended. Has good bowel sounds. No hepatosplenomegaly appreciated.  EXTREMITIES: No evidence of any cyanosis, clubbing, or peripheral edema.  +2 pedal and radial pulses bilaterally.  NEUROLOGIC: The patient is alert, awake, and oriented x3 with no focal motor or sensory deficits appreciated bilaterally.  SKIN: Moist and warm with no rashes appreciated.  Psych: Not anxious, depressed LN: No inguinal LN enlargement    Antibiotics   Anti-infectives (From admission, onward)   Start     Dose/Rate Route Frequency Ordered Stop   08/25/17 1500  ciprofloxacin (CIPRO) IVPB 400 mg     400 mg 200 mL/hr over 60 Minutes Intravenous Every 12 hours 08/25/17 1423     08/25/17 1200  ciprofloxacin (CIPRO) IVPB 400 mg  Status:  Discontinued     400 mg 200 mL/hr over 60 Minutes Intravenous Every 12 hours 08/25/17 1008 08/25/17 1423   08/25/17 1200  metroNIDAZOLE (FLAGYL) IVPB 500 mg     500 mg 100 mL/hr over 60 Minutes Intravenous Every 8 hours 08/25/17 1008     08/25/17 1030  ciprofloxacin (CIPRO) IVPB 400 mg  Status:  Discontinued     400 mg 200 mL/hr over 60 Minutes Intravenous   Once 08/25/17 1017 08/25/17 1423   08/25/17 1030  metroNIDAZOLE (FLAGYL) IVPB 500 mg  Status:  Discontinued     500 mg 100 mL/hr over 60 Minutes Intravenous  Once 08/25/17 1017 08/25/17 1423      Medications   Scheduled Meds: . enoxaparin (LOVENOX) injection  40 mg Subcutaneous Q24H   Continuous Infusions: . ciprofloxacin Stopped (08/26/17 0359)  . dextrose 5 % and 0.45% NaCl 100 mL/hr at 08/26/17 0400  . metronidazole 500 mg (08/26/17 1210)   PRN Meds:.acetaminophen **OR** acetaminophen, HYDROcodone-acetaminophen, HYDROmorphone (DILAUDID) injection, ondansetron **OR** ondansetron (ZOFRAN) IV   Data Review:   Micro Results No results found for this or any previous visit (from the past 240 hour(s)).  Radiology Reports Dg Abd 1 View  Result Date: 08/25/2017 CLINICAL DATA:  Nasogastric tube placement EXAM: ABDOMEN - 1 VIEW COMPARISON:  Portable exam 1434 hours compared to 1029 hours FINDINGS: Nasogastric tube coiled in proximal stomach with tip projecting over pylorus/duodenal bulb. Contain contrast within mildly prominent RIGHT renal collecting system. Bibasilar atelectasis. Bowel gas pattern normal. IMPRESSION: Tip of nasogastric tube projects over pylorus or proximal duodenal bulb, consider withdrawal 5 cm. Electronically Signed   By: Lavonia Dana M.D.   On: 08/25/2017 14:59   Ct Abdomen Pelvis W Contrast  Result Date: 08/25/2017 CLINICAL DATA:  Abdominal distention. EXAM: CT ABDOMEN AND PELVIS WITH CONTRAST TECHNIQUE: Multidetector CT imaging of the abdomen and pelvis was performed using the standard protocol following bolus administration of intravenous contrast. CONTRAST:  170m ISOVUE-300 IOPAMIDOL (ISOVUE-300) INJECTION 61% COMPARISON:  10/02/2016 FINDINGS: Lower chest: No acute abnormality. Hepatobiliary: No focal liver abnormality is seen. No gallstones, gallbladder wall thickening, or biliary dilatation. Pancreas: Unremarkable. No pancreatic ductal dilatation or surrounding  inflammatory changes. Spleen: Normal in size without focal abnormality. Adrenals/Urinary Tract: The adrenal glands are normal. Small hypodensity within the interpolar left kidney measures 6 mm and is too small to characterize. Similarly there is a small low density structure in the upper pole of right kidney measuring 5 mm. No mass or hydronephrosis identified urinary bladder is negative. Stomach/Bowel: Small  hiatal hernia. The proximal small bowel loops are unremarkable. The mid and distal small bowel loops are abnormal in appearance. Loops measure up to 3.3 cm. There is marked wall thickening and inflammation involving the distal aspect of the terminal ileum with wall thickness measuring up to 1.3 cm. Here, there is significantly decreased caliber of the bowel lumen. Proximal to this there is abnormal increase caliber of the small bowel loops. The colon has a normal caliber. No significant wall thickening or inflammation involving the colon. Vascular/Lymphatic: Normal appearance of the abdominal aorta. No enlarged abdominal lymph nodes. Small bowel mesenteric node is prominent measuring 9 mm, likely reactive. Right lower quadrant ileocolic lymph nodes are prominent measuring up to 9 mm, image 53/2. Likely reactive. No iliac or inguinal adenopathy. Reproductive: Uterus and bilateral adnexa are unremarkable. Other: There is a moderate volume of free fluid identified within the pelvis. No discrete abscess identified. Musculoskeletal: Mild degenerative disc disease within the lower thoracic and lumbar spine. No suspicious bone lesions. IMPRESSION: 1. There is abnormal wall thickening and inflammation involving the terminal ileum compatible with ileitis. Likely inflammatory or infectious ileitis. Inflammatory bowel disease such as Crohn's disease is not excluded. Moderate free fluid noted within the pelvis, but no definite evidence for abscess or penetrating disease at this time. 2. Proximal to thickened and inflamed  terminal ileum the mid and distal small bowel loops are increased in caliber compatible with at least a partial small bowel obstruction. 3. Small hiatal hernia. Electronically Signed   By: Kerby Moors M.D.   On: 08/25/2017 09:08   Dg Abd Portable 1 View  Result Date: 08/25/2017 CLINICAL DATA:  NG tube placement. EXAM: PORTABLE ABDOMEN - 1 VIEW COMPARISON:  CT 08/25/2017. FINDINGS: NG tube noted with tip below left hemidiaphragm. NG tube side hole noted the gastroesophageal junction. Tube advancement of approximately 8 cm should be considered. Mild right base subsegmental atelectasis. Contrast in the right kidney. IMPRESSION: NG tube noted with tip below left hemidiaphragm. NG tube side hole noted at the gastroesophageal junction. NG tube advancement of approximately 8 cm should be considered. Electronically Signed   By: Marcello Moores  Register   On: 08/25/2017 10:46     CBC Recent Labs  Lab 08/25/17 0735 08/25/17 1150 08/26/17 0357  WBC 12.1* 19.2* 11.8*  HGB 11.7* 11.1* 10.5*  HCT 36.2 33.9* 32.2*  PLT 260 193 172  MCV 64.1* 63.2* 64.3*  MCH 20.8* 20.7* 21.0*  MCHC 32.4 32.7 32.6  RDW 15.8* 16.0* 16.2*    Chemistries  Recent Labs  Lab 08/25/17 0735 08/25/17 1150 08/26/17 0357  NA 139  --  137  K 4.1  --  3.5  CL 108  --  105  CO2 22  --  27  GLUCOSE 114*  --  113*  BUN 13  --  9  CREATININE 0.55 0.59 0.62  CALCIUM 8.9  --  8.3*  AST 18  --   --   ALT 15  --   --   ALKPHOS 145*  --   --   BILITOT 0.9  --   --    ------------------------------------------------------------------------------------------------------------------ estimated creatinine clearance is 89.8 mL/min (by C-G formula based on SCr of 0.62 mg/dL). ------------------------------------------------------------------------------------------------------------------ No results for input(s): HGBA1C in the last 72  hours. ------------------------------------------------------------------------------------------------------------------ No results for input(s): CHOL, HDL, LDLCALC, TRIG, CHOLHDL, LDLDIRECT in the last 72 hours. ------------------------------------------------------------------------------------------------------------------ No results for input(s): TSH, T4TOTAL, T3FREE, THYROIDAB in the last 72 hours.  Invalid input(s): FREET3 ------------------------------------------------------------------------------------------------------------------ No results  for input(s): VITAMINB12, FOLATE, FERRITIN, TIBC, IRON, RETICCTPCT in the last 72 hours.  Coagulation profile No results for input(s): INR, PROTIME in the last 168 hours.  No results for input(s): DDIMER in the last 72 hours.  Cardiac Enzymes No results for input(s): CKMB, TROPONINI, MYOGLOBIN in the last 168 hours.  Invalid input(s): CK ------------------------------------------------------------------------------------------------------------------ Invalid input(s): [place]   Patient is 52 year old African-American female presenting with generalized abdominal pain  1.  Generalized abdominal pain due to terminal ileitis GI has been consulted has not seen the patient Patient has a NG tube in place and has a partial small bowel obstruction I have message surgery regarding the consult Continue IV fluids and antibiotics  2.  Chronic constipation will hold off on any medications for now  3.  Nicotine abuse smoking cessation provided       Code Status Orders  (From admission, onward)        Start     Ordered   08/25/17 1150  Full code  Continuous     08/25/17 1149    Code Status History    Date Active Date Inactive Code Status Order ID Comments User Context   10/02/2016 2333 10/03/2016 2141 Full Code [id number]  Vickie Epley, MD ED           Consults GI and surgery  DVT Prophylaxis  Lovenox  Lab Results  Component Value Date   PLT 172 08/26/2017     Time Spent in minutes 35 minutes greater than 50% of time spent in care coordination and counseling patient regarding the condition and plan of care.   Dustin Flock M.D on 08/26/2017 at 12:54 PM  Between 7am to 6pm - Pager - [phone]  After 6pm go to [email or web address] - Proofreader  [employer]   Office  [phone]

## 2017-08-26 NOTE — Consult Note (Addendum)
Cephas Darby, MD [address]  [place]  [place], [place] [address]  Main: [phone]  Fax: [phone] Pager: [phone]   Consultation  Referring Provider:     No ref. provider found Primary Care Physician:  Patient, No Pcp Per Primary Gastroenterologist: none         Reason for Consultation:     Partial small bowel obstruction, terminal ileitis  Date of Admission:  08/25/2017 Date of Consultation:  08/26/2017         HPI:   Katelyn Garrett is a 52 y.o. African-American female with chronic tobacco use who presents with 3 days' history of severe abdominal distention, constipation, nausea and vomiting, lower abdominal pain. She reports history of chronic constipation. She underwent CT in the emergency room and was found to have inflammation in the teminal ileum with dilated small bowel loops consistent with partial small bowel obstruction. General surgery has been consulted. She was conservatively managed with bowel rest, NG tube to decompression. Empirically started on Cipro and Flagyl. GI is consulted for further evaluation. Patient had similar episode in 09/2016. At that time CT revealed small bowel intussusception, surgery was consulted. She was conservatively managed and discharged home. patient reports having intermittent right lower quadrant pain and has been taking ibuprofen as needed. Gen. She denies having abdominal surgeries  Today, since placement of NG tube, patient reports that she has been passing flatus. She had 800 mL of bilious output. NG tube is repositioned into the stomach as it was in the proximal duodenum. She feels hungry, although nauseous, asking if she can try some liquids. She is tolerating ice chips well   NSAIDs: ibuprofen intermittently for abdominal pain  Antiplts/Anticoagulants/Anti thrombotics: none  GI Procedures: none She denies family history of inflammatory bowel disease, GI malignancy Patient denies recent travel or sick  contacts HIV negative  Past Medical History:  Diagnosis Date  . Chronic constipation     Past Surgical History:  Procedure Laterality Date  . TUBAL LIGATION      Prior to Admission medications   Not on File    History reviewed. No pertinent family history.   Social History   Tobacco Use  . Smoking status: Current Every Day Smoker  . Smokeless tobacco: Never Used  Substance Use Topics  . Alcohol use: No  . Drug use: Not on file    Allergies as of 08/25/2017  . (No Known Allergies)    Review of Systems:    All systems reviewed and negative except where noted in HPI.   Physical Exam:  Vital signs in last 24 hours: Temp:  [97.9 F (36.6 C)-99.1 F (37.3 C)] 97.9 F (36.6 C) (04/20 1620) Pulse Rate:  [47-85] 47 (04/20 1620) Resp:  [15-17] 16 (04/20 1620) BP: (121-140)/(70-73) 140/70 (04/20 1620) SpO2:  [96 %-98 %] 98 % (04/20 1620) Last BM Date: 08/21/17 General: Ill-appearing, in mild distress, cooperative Head:  Normocephalic and atraumatic. Eyes:   No icterus.   Conjunctiva pink. PERRLA. Ears:  Normal auditory acuity. Neck:  Supple; no masses or thyroidomegaly Lungs: Respirations even and unlabored. Lungs clear to auscultation bilaterally.   No wheezes, crackles, or rhonchi.  Heart:  Regular rate and rhythm;  Without murmur, clicks, rubs or gallops Abdomen:  Soft, nondistended, lower abdominal tenderness, worsened right lower quadrant. Normal bowel sounds. No appreciable masses or hepatomegaly.  No rebound or guarding.  Rectal:  Not performed. Msk:  Symmetrical without gross deformities.  Strength normal Extremities:  Without edema, cyanosis or clubbing. Neurologic:  Alert and oriented x3;  grossly normal neurologically. Skin:  Intact without significant lesions or rashes. Cervical Nodes:  No significant cervical adenopathy. Psych:  Alert and cooperative. Normal affect.  LAB RESULTS: CBC Latest Ref Rng & Units 08/26/2017 08/25/2017 08/25/2017  WBC 3.6 -  11.0 K/uL 11.8(H) 19.2(H) 12.1(H)  Hemoglobin 12.0 - 16.0 g/dL 10.5(L) 11.1(L) 11.7(L)  Hematocrit 35.0 - 47.0 % 32.2(L) 33.9(L) 36.2  Platelets 150 - 440 K/uL 172 193 260    BMET BMP Latest Ref Rng & Units 08/26/2017 08/25/2017 08/25/2017  Glucose 65 - 99 mg/dL 113(H) - 114(H)  BUN 6 - 20 mg/dL 9 - 13  Creatinine 0.44 - 1.00 mg/dL 0.62 0.59 0.55  Sodium 135 - 145 mmol/L 137 - 139  Potassium 3.5 - 5.1 mmol/L 3.5 - 4.1  Chloride 101 - 111 mmol/L 105 - 108  CO2 22 - 32 mmol/L 27 - 22  Calcium 8.9 - 10.3 mg/dL 8.3(L) - 8.9    LFT Hepatic Function Latest Ref Rng & Units 08/25/2017 10/02/2016 02/23/2013  Total Protein 6.5 - 8.1 g/dL 7.5 8.4(H) 7.4  Albumin 3.5 - 5.0 g/dL 3.8 4.6 3.6  AST 15 - 41 U/L 18 21 29   ALT 14 - 54 U/L 15 13(L) 29  Alk Phosphatase 38 - 126 U/L 145(H) 134(H) 137(H)  Total Bilirubin 0.3 - 1.2 mg/dL 0.9 0.5 0.2     STUDIES: Dg Abd 1 View  Result Date: 08/25/2017 CLINICAL DATA:  Nasogastric tube placement EXAM: ABDOMEN - 1 VIEW COMPARISON:  Portable exam 1434 hours compared to 1029 hours FINDINGS: Nasogastric tube coiled in proximal stomach with tip projecting over pylorus/duodenal bulb. Contain contrast within mildly prominent RIGHT renal collecting system. Bibasilar atelectasis. Bowel gas pattern normal. IMPRESSION: Tip of nasogastric tube projects over pylorus or proximal duodenal bulb, consider withdrawal 5 cm. Electronically Signed   By: Lavonia Dana M.D.   On: 08/25/2017 14:59   Ct Abdomen Pelvis W Contrast  Result Date: 08/25/2017 CLINICAL DATA:  Abdominal distention. EXAM: CT ABDOMEN AND PELVIS WITH CONTRAST TECHNIQUE: Multidetector CT imaging of the abdomen and pelvis was performed using the standard protocol following bolus administration of intravenous contrast. CONTRAST:  155m ISOVUE-300 IOPAMIDOL (ISOVUE-300) INJECTION 61% COMPARISON:  10/02/2016 FINDINGS: Lower chest: No acute abnormality. Hepatobiliary: No focal liver abnormality is seen. No gallstones,  gallbladder wall thickening, or biliary dilatation. Pancreas: Unremarkable. No pancreatic ductal dilatation or surrounding inflammatory changes. Spleen: Normal in size without focal abnormality. Adrenals/Urinary Tract: The adrenal glands are normal. Small hypodensity within the interpolar left kidney measures 6 mm and is too small to characterize. Similarly there is a small low density structure in the upper pole of right kidney measuring 5 mm. No mass or hydronephrosis identified urinary bladder is negative. Stomach/Bowel: Small hiatal hernia. The proximal small bowel loops are unremarkable. The mid and distal small bowel loops are abnormal in appearance. Loops measure up to 3.3 cm. There is marked wall thickening and inflammation involving the distal aspect of the terminal ileum with wall thickness measuring up to 1.3 cm. Here, there is significantly decreased caliber of the bowel lumen. Proximal to this there is abnormal increase caliber of the small bowel loops. The colon has a normal caliber. No significant wall thickening or inflammation involving the colon. Vascular/Lymphatic: Normal appearance of the abdominal aorta. No enlarged abdominal lymph nodes. Small bowel mesenteric node is prominent measuring 9 mm, likely reactive. Right lower quadrant ileocolic lymph nodes are prominent measuring up  to 9 mm, image 53/2. Likely reactive. No iliac or inguinal adenopathy. Reproductive: Uterus and bilateral adnexa are unremarkable. Other: There is a moderate volume of free fluid identified within the pelvis. No discrete abscess identified. Musculoskeletal: Mild degenerative disc disease within the lower thoracic and lumbar spine. No suspicious bone lesions. IMPRESSION: 1. There is abnormal wall thickening and inflammation involving the terminal ileum compatible with ileitis. Likely inflammatory or infectious ileitis. Inflammatory bowel disease such as Crohn's disease is not excluded. Moderate free fluid noted within  the pelvis, but no definite evidence for abscess or penetrating disease at this time. 2. Proximal to thickened and inflamed terminal ileum the mid and distal small bowel loops are increased in caliber compatible with at least a partial small bowel obstruction. 3. Small hiatal hernia. Electronically Signed   By: Kerby Moors M.D.   On: 08/25/2017 09:08   Dg Abd Portable 1v  Result Date: 08/26/2017 CLINICAL DATA:  Status post NG tube placement. EXAM: PORTABLE ABDOMEN - 1 VIEW COMPARISON:  Plain film of the abdomen 08/25/2017. FINDINGS: NG tube has been pulled back with the tip now projecting in the distal body of the stomach near the antrum. No new abnormality. IMPRESSION: As above. Electronically Signed   By: Inge Rise M.D.   On: 08/26/2017 17:02   Dg Abd Portable 1 View  Result Date: 08/25/2017 CLINICAL DATA:  NG tube placement. EXAM: PORTABLE ABDOMEN - 1 VIEW COMPARISON:  CT 08/25/2017. FINDINGS: NG tube noted with tip below left hemidiaphragm. NG tube side hole noted the gastroesophageal junction. Tube advancement of approximately 8 cm should be considered. Mild right base subsegmental atelectasis. Contrast in the right kidney. IMPRESSION: NG tube noted with tip below left hemidiaphragm. NG tube side hole noted at the gastroesophageal junction. NG tube advancement of approximately 8 cm should be considered. Electronically Signed   By: Marcello Moores  Register   On: 08/25/2017 10:46      Impression / Plan:   Katelyn Garrett is a 52 y.o. African-American female with chronic tobacco use, presents with partial small bowel obstruction.CT revealed terminal ileitis with enlargement of mesenteric lymph nodes as well as moderate volume of free fluid in the pelvis. There was no obvious evidence of discrete abscess or enteric fistula. Differentials include viral enteritis or Crohn's disease or TB ileitis or lymphoma.   - Continue conservative management as patient is clinically improving - nothing by  mouth, NG tube decompression - Recommend to discontinue antibiotics - HIV negative, check QuantiFERON gold - Empiric trial of IV steroids for possible small bowel Crohn's - Do not recommend endoscopic evaluation until resolution of small bowel obstruction. 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  Will follow along with you    LOS: 1 day   Sherri Sear, MD  08/26/2017, 5:39 PM   Note: This dictation was prepared with Dragon dictation along with smaller phrase technology. Any transcriptional errors that result from this process are unintentional.

## 2017-08-26 NOTE — Consult Note (Addendum)
SURGICAL CONSULTATION NOTE (initial) - cpt: 53299  HISTORY OF PRESENT ILLNESS (HPI):  52 y.o. female presented to [facility] ED yesterday for RLQ abdominal pain. Patient reports she was constipated x 3 days with increased belching and emesis. CT was performed, NG tube was inserted, and patient was admitted to hospitalist service for possible Crohn's disease. It appears that NG tube was found on post-insertion abdominal x-ray to be in the proximal stomach and gastro-esophogeal junction with recommendation by radiologist to advance tube 8 cm. On subsequent abdominal x-ray, the NG tube was visualized in the duodenum with recommendation by radiologist to withdraw the tube 5 cm. However, it seems patient was transferred from ED to medical floor, and NG tube was never withdrawn. Patient reports her pain was controlled this morning with a single dose of dilaudid, since which her pain has not returned with no additional medication required. Patient also now reports she's been passing flatus, though NG tube output remains high volume. Patient denies fever/chills, CP, or SOB.  Of note, patient states previously advised follow-up was never done due to son being shot in his head and killed by his girlfriend.  Surgery is consulted by medical physician Dr. Posey Pronto in this context for evaluation and management of partial SBO.  PAST MEDICAL HISTORY (PMH):  Past Medical History:  Diagnosis Date  . Chronic constipation      PAST SURGICAL HISTORY ([place]):  Past Surgical History:  Procedure Laterality Date  . TUBAL LIGATION       MEDICATIONS:  Prior to Admission medications   Not on File     ALLERGIES:  No Known Allergies   SOCIAL HISTORY:  Social History   Socioeconomic History  . Marital status: Single    Spouse name: Not on file  . Number of children: Not on file  . Years of education: Not on file  . Highest education level: Not on file  Occupational History  . Not on file  Social Needs  . Financial  resource strain: Not on file  . Food insecurity:    Worry: Not on file    Inability: Not on file  . Transportation needs:    Medical: Not on file    Non-medical: Not on file  Tobacco Use  . Smoking status: Current Every Day Smoker  . Smokeless tobacco: Never Used  Substance and Sexual Activity  . Alcohol use: No  . Drug use: Not on file  . Sexual activity: Not on file  Lifestyle  . Physical activity:    Days per week: Not on file    Minutes per session: Not on file  . Stress: Not on file  Relationships  . Social connections:    Talks on phone: Not on file    Gets together: Not on file    Attends religious service: Not on file    Active member of club or organization: Not on file    Attends meetings of clubs or organizations: Not on file    Relationship status: Not on file  . Intimate partner violence:    Fear of current or ex partner: Not on file    Emotionally abused: Not on file    Physically abused: Not on file    Forced sexual activity: Not on file  Other Topics Concern  . Not on file  Social History Narrative  . Not on file    The patient currently resides (home / rehab facility / nursing home): Home The patient normally is (ambulatory / bedbound): Ambulatory  FAMILY HISTORY:  History reviewed. No pertinent family history.   REVIEW OF SYSTEMS:  Constitutional: denies weight loss, fever, chills, or sweats  Eyes: denies any other vision changes, history of eye injury  ENT: denies sore throat, hearing problems  Respiratory: denies shortness of breath, wheezing  Cardiovascular: denies chest pain, palpitations  Gastrointestinal: abdominal pain, N/V, and bowel function as per HPI Genitourinary: denies burning with urination or urinary frequency Musculoskeletal: denies any other joint pains or cramps  Skin: denies any other rashes or skin discolorations  Neurological: denies any other headache, dizziness, weakness  Psychiatric: denies any other depression, anxiety    All other review of systems were negative   VITAL SIGNS:  Temp:  [98.8 F (37.1 C)-99.1 F (37.3 C)] 98.8 F (37.1 C) (04/20 0425) Pulse Rate:  [59-85] 59 (04/20 0425) Resp:  [15-17] 17 (04/20 0425) BP: (121-126)/(70-73) 126/70 (04/20 0425) SpO2:  [96 %-97 %] 97 % (04/20 0425)     Height: 5' 7"  (170.2 cm) Weight: 164 lb 14.4 oz (74.8 kg) BMI (Calculated): 25.82   INTAKE/OUTPUT:  This shift: Total I/O In: 100 [IV Piggyback:100] Out: -   Last 2 shifts: @IOLAST2SHIFTS @   PHYSICAL EXAM:  Constitutional:  -- Normal body habitus  -- Awake, alert, and oriented x3, no apparent distress Eyes:  -- Pupils equally round and reactive to light  -- No scleral icterus, B/L no occular discharge Ear, nose, throat: -- Neck is FROM WNL -- No jugular venous distension  Pulmonary:  -- No wheezes or rhales -- Equal breath sounds bilaterally -- Breathing non-labored at rest Cardiovascular:  -- S1, S2 present  -- No pericardial rubs  Gastrointestinal:  -- Abdomen soft, nontender, and non-distended with no guarding or rebound tenderness -- 49F NG tube is to high continuous suction rather than low intermittent suction (13F or 17F preferred for SBO's) -- No abdominal masses appreciated, pulsatile or otherwise  Musculoskeletal and Integumentary:  -- Wounds or skin discoloration: None appreciated -- Extremities: B/L UE and LE FROM, hands and feet warm, no edema  Neurologic:  -- Motor function: Intact and symmetric -- Sensation: Intact and symmetric Psychiatric:  -- Mood and affect WNL  Labs:  CBC Latest Ref Rng & Units 08/26/2017 08/25/2017 08/25/2017  WBC 3.6 - 11.0 K/uL 11.8(H) 19.2(H) 12.1(H)  Hemoglobin 12.0 - 16.0 g/dL 10.5(L) 11.1(L) 11.7(L)  Hematocrit 35.0 - 47.0 % 32.2(L) 33.9(L) 36.2  Platelets 150 - 440 K/uL 172 193 260   CMP Latest Ref Rng & Units 08/26/2017 08/25/2017 08/25/2017  Glucose 65 - 99 mg/dL 113(H) - 114(H)  BUN 6 - 20 mg/dL 9 - 13  Creatinine 0.44 - 1.00 mg/dL 0.62  0.59 0.55  Sodium 135 - 145 mmol/L 137 - 139  Potassium 3.5 - 5.1 mmol/L 3.5 - 4.1  Chloride 101 - 111 mmol/L 105 - 108  CO2 22 - 32 mmol/L 27 - 22  Calcium 8.9 - 10.3 mg/dL 8.3(L) - 8.9  Total Protein 6.5 - 8.1 g/dL - - 7.5  Total Bilirubin 0.3 - 1.2 mg/dL - - 0.9  Alkaline Phos 38 - 126 U/L - - 145(H)  AST 15 - 41 U/L - - 18  ALT 14 - 54 U/L - - 15   Imaging studies:  CT Abdomen and Pelvis with Contrast (08/25/2017) - personally reviewed and discussed with patient 1. There is abnormal wall thickening and inflammation involving the terminal ileum compatible with ileitis. Likely inflammatory or infectious ileitis. Inflammatory bowel disease such as Crohn's disease is not  excluded. Moderate free fluid noted within the pelvis, but no definite evidence for abscess or penetrating disease at this time. 2. Proximal to thickened and inflamed terminal ileum the mid and distal small bowel loops are increased in caliber compatible with at least a partial small bowel obstruction. 3. Small hiatal hernia.  Assessment/Plan: (ICD-10's: K52.9, possible K50.00) 52 y.o. female with partial SBO attributed to terminal ileitis of uncertain etiology, complicated by chronic untreated constipation, chronic ongoing tobacco abuse (smoking), and a lack of routine medical care due to prior uninsured status.   - NPO for now, IV fluids  - agree with GI consultation  - NG tube withdrawn 5 cm and resecured, follow-up abdominal x-ray ordered  - high-output NG drainage likely attributable to NG tube in duodenum on yesterday's abdominal x-ray  - anticipate removal of NG tube tomorrow morning if patient continues +flatus, feeling better with decreased NG drainage  - anticipate outpatient colonoscopy with TI biopsies +/- CT enterography or SBFT upon resolution of acute pathology  - pain control prn (minimize narcotics), medical management as per primary medical team  - DVT prophylaxis, ambulation encouraged  All of  the above findings and recommendations were discussed with the patient and her RN, and all of patient's questions were answered to her expressed satisfaction.  Thank you for the opportunity to participate in this patient's care.   -- Marilynne Drivers Rosana Hoes, MD, [place]: [facility] - Partnering for exceptional care. Office: [phone]

## 2017-08-27 DIAGNOSIS — K50012 Crohn's disease of small intestine with intestinal obstruction: Secondary | ICD-10-CM

## 2017-08-27 DIAGNOSIS — K56600 Partial intestinal obstruction, unspecified as to cause: Secondary | ICD-10-CM

## 2017-08-27 LAB — BASIC METABOLIC PANEL
Anion gap: 6 (ref 5–15)
BUN: 10 mg/dL (ref 6–20)
CHLORIDE: 105 mmol/L (ref 101–111)
CO2: 26 mmol/L (ref 22–32)
CREATININE: 0.75 mg/dL (ref 0.44–1.00)
Calcium: 8.7 mg/dL — ABNORMAL LOW (ref 8.9–10.3)
GFR calc Af Amer: >60 mL/min (ref >60)
GFR calc non Af Amer: >60 mL/min (ref >60)
GLUCOSE: 129 mg/dL — AB (ref 65–99)
POTASSIUM: 4 mmol/L (ref 3.5–5.1)
Sodium: 137 mmol/L (ref 135–145)

## 2017-08-27 LAB — CBC
HEMATOCRIT: 33.1 % — AB (ref 35.0–47.0)
Hemoglobin: 10.7 g/dL — ABNORMAL LOW (ref 12.0–16.0)
MCH: 20.8 pg — AB (ref 26.0–34.0)
MCHC: 32.3 g/dL (ref 32.0–36.0)
MCV: 64.3 fL — ABNORMAL LOW (ref 80.0–100.0)
PLATELETS: 176 10*3/uL (ref 150–440)
RBC: 5.14 MIL/uL (ref 3.80–5.20)
RDW: 16.2 % — AB (ref 11.5–14.5)
WBC: 7.6 10*3/uL (ref 3.6–11.0)

## 2017-08-27 MED ORDER — ALPRAZOLAM 0.25 MG PO TABS
0.2500 mg | ORAL_TABLET | Freq: Three times a day (TID) | ORAL | Status: DC | PRN
  Administered 2017-08-27: 0.25 mg via ORAL
  Filled 2017-08-27: qty 1

## 2017-08-27 MED ORDER — DIPHENHYDRAMINE HCL 50 MG/ML IJ SOLN
25.0000 mg | Freq: Once | INTRAMUSCULAR | Status: AC
  Administered 2017-08-27: 25 mg via INTRAVENOUS
  Filled 2017-08-27: qty 0.5

## 2017-08-27 MED ORDER — PEG 3350-KCL-NA BICARB-NACL 420 G PO SOLR
4000.0000 mL | Freq: Once | ORAL | Status: AC
  Administered 2017-08-27: 4000 mL via ORAL
  Filled 2017-08-27: qty 4000

## 2017-08-27 MED ORDER — NICOTINE 21 MG/24HR TD PT24
21.0000 mg | MEDICATED_PATCH | Freq: Every day | TRANSDERMAL | Status: DC
  Administered 2017-08-27 – 2017-08-28 (×2): 21 mg via TRANSDERMAL
  Filled 2017-08-27 (×2): qty 1

## 2017-08-27 NOTE — Progress Notes (Signed)
SURGICAL PROGRESS NOTE (cpt [phone])  Hospital Day(s): 2.   Post op day(s):  Marland Kitchen   Interval History: Patient seen and examined, no acute events or new complaints overnight. Patient reports complete resolution of her prior abdominal pain, nausea, and distention with +flatus and much decreased NG tube drainage since NG tube repositioned into patient's stomach rather than duodenum yesterday, and she denies any fever/chills, CP, or SOB.  Review of Systems:  Constitutional: denies fever, chills  HEENT: denies cough or congestion  Respiratory: denies any shortness of breath  Cardiovascular: denies chest pain or palpitations  Gastrointestinal: abdominal pain, N/V, and bowel function as per interval history Genitourinary: denies burning with urination or urinary frequency Musculoskeletal: denies pain, decreased motor or sensation Integumentary: denies any other rashes or skin discolorations Neurological: denies HA or vision/hearing changes   Vital signs in last 24 hours: [min-max] current  Temp:  [97.9 F (36.6 C)-98.4 F (36.9 C)] 98.4 F (36.9 C) (04/20 2022) Pulse Rate:  [47-63] 63 (04/20 2022) Resp:  [16-18] 18 (04/20 2022) BP: (131-140)/(70-72) 131/72 (04/20 2022) SpO2:  [98 %] 98 % (04/20 2022)     Height: 5' 7"  (170.2 cm) Weight: 164 lb 14.4 oz (74.8 kg) BMI (Calculated): 25.82   Intake/Output this shift:  No intake/output data recorded.   Intake/Output last 2 shifts:  @IOLAST2SHIFTS @   Physical Exam:  Constitutional: alert, cooperative and no distress  HENT: normocephalic without obvious abnormality  Eyes: PERRL, EOM's grossly intact and symmetric  Neuro: CN II - XII grossly intact and symmetric without deficit  Respiratory: breathing non-labored at rest  Cardiovascular: regular rate and sinus rhythm  Gastrointestinal: soft, completely non-tender, and non-distended Musculoskeletal: UE and LE FROM, no edema or wounds, motor and sensation grossly intact, NT   Labs:  CBC  Latest Ref Rng & Units 08/27/2017 08/26/2017 08/25/2017  WBC 3.6 - 11.0 K/uL 7.6 11.8(H) 19.2(H)  Hemoglobin 12.0 - 16.0 g/dL 10.7(L) 10.5(L) 11.1(L)  Hematocrit 35.0 - 47.0 % 33.1(L) 32.2(L) 33.9(L)  Platelets 150 - 440 K/uL 176 172 193   CMP Latest Ref Rng & Units 08/27/2017 08/26/2017 08/25/2017  Glucose 65 - 99 mg/dL 129(H) 113(H) -  BUN 6 - 20 mg/dL 10 9 -  Creatinine 0.44 - 1.00 mg/dL 0.75 0.62 0.59  Sodium 135 - 145 mmol/L 137 137 -  Potassium 3.5 - 5.1 mmol/L 4.0 3.5 -  Chloride 101 - 111 mmol/L 105 105 -  CO2 22 - 32 mmol/L 26 27 -  Calcium 8.9 - 10.3 mg/dL 8.7(L) 8.3(L) -  Total Protein 6.5 - 8.1 g/dL - - -  Total Bilirubin 0.3 - 1.2 mg/dL - - -  Alkaline Phos 38 - 126 U/L - - -  AST 15 - 41 U/L - - -  ALT 14 - 54 U/L - - -    Assessment/Plan: (ICD-10's: K52.9, possible K50.00) 52 y.o. female with partial SBO attributed to terminal ileitis of uncertain etiology, complicated by chronic untreated constipation, chronic ongoing tobacco abuse (smoking), and a lack of routine medical care due to prior uninsured status.              - GI consultation appreciated             - NG tube removed, clear liquids diet ordered  - if tolerates clear liquids, anticipate advance diet as tolerated  - no surgical intervention indicated at this time, will likely signoff tomorrow             - medical  management as per primary medical team             - DVT prophylaxis, ambulation encouraged  All of the above findings and recommendations were discussed with the patient and her RN, and all of patient's questions were answered to her expressed satisfaction.  Thank you for the opportunity to participate in this patient's care.  -- Marilynne Drivers Rosana Hoes, MD, [place]: [facility] - Partnering for exceptional care. Office: [phone]

## 2017-08-27 NOTE — Progress Notes (Signed)
[place] at [facility]                                                                                                                                                                                  Patient Demographics   Katelyn Garrett, is a 52 y.o. female, DOB - May 12, 1965, YPP:[id number]  Admit date - 08/25/2017   Admitting Physician Dustin Flock, MD  Outpatient Primary MD for the patient is Patient, No Pcp Per   LOS - 2  Subjective: Patient feeling better abdominal pain now resolved wants to eat NG tube taken out   Review of Systems:   CONSTITUTIONAL: No documented fever. No fatigue, weakness. No weight gain, no weight loss.  EYES: No blurry or double vision.  ENT: No tinnitus. No postnasal drip. No redness of the oropharynx.  RESPIRATORY: No cough, no wheeze, no hemoptysis. No dyspnea.  CARDIOVASCULAR: No chest pain. No orthopnea. No palpitations. No syncope.  GASTROINTESTINAL: Positive nausea, no vomiting or diarrhea.  Positive abdominal pain. No melena or hematochezia.  GENITOURINARY: No dysuria or hematuria.  ENDOCRINE: No polyuria or nocturia. No heat or cold intolerance.  HEMATOLOGY: No anemia. No bruising. No bleeding.  INTEGUMENTARY: No rashes. No lesions.  MUSCULOSKELETAL: No arthritis. No swelling. No gout.  NEUROLOGIC: No numbness, tingling, or ataxia. No seizure-type activity.  PSYCHIATRIC: No anxiety. No insomnia. No ADD.    Vitals:   Vitals:   08/26/17 0425 08/26/17 1620 08/26/17 2022 08/27/17 0727  BP: 126/70 140/70 131/72 (!) 143/72  Pulse: (!) 59 (!) 47 63 (!) 54  Resp: 17 16 18 18   Temp: 98.8 F (37.1 C) 97.9 F (36.6 C) 98.4 F (36.9 C) 97.9 F (36.6 C)  TempSrc: Oral Oral Oral Oral  SpO2: 97% 98% 98% 99%  Weight:      Height:        Wt Readings from Last 3 Encounters:  08/25/17 74.8 kg (164 lb 14.4 oz)  10/03/16 79.9 kg (176 lb 1.6 oz)  02/05/16 63.5 kg (140 lb)     Intake/Output Summary (Last 24 hours) at  08/27/2017 1139 Last data filed at 08/27/2017 0827 Gross per 24 hour  Intake 3091.67 ml  Output 980 ml  Net 2111.67 ml    Physical Exam:   GENERAL: Pleasant-appearing in no apparent distress.  HEAD, EYES, EARS, NOSE AND THROAT: Atraumatic, normocephalic. Extraocular muscles are intact. Pupils equal and reactive to light. Sclerae anicteric. No conjunctival injection. No oro-pharyngeal erythema.  NECK: Supple. There is no jugular venous distention. No bruits, no lymphadenopathy, no thyromegaly.  HEART: Regular rate and rhythm,. No murmurs, no rubs, no clicks.  LUNGS: Clear to auscultation bilaterally. No rales  or rhonchi. No wheezes.  ABDOMEN: Soft, flat, nontender, nondistended. Has good bowel sounds. No hepatosplenomegaly appreciated.  EXTREMITIES: No evidence of any cyanosis, clubbing, or peripheral edema.  +2 pedal and radial pulses bilaterally.  NEUROLOGIC: The patient is alert, awake, and oriented x3 with no focal motor or sensory deficits appreciated bilaterally.  SKIN: Moist and warm with no rashes appreciated.  Psych: Not anxious, depressed LN: No inguinal LN enlargement    Antibiotics   Anti-infectives (From admission, onward)   Start     Dose/Rate Route Frequency Ordered Stop   08/25/17 1500  ciprofloxacin (CIPRO) IVPB 400 mg  Status:  Discontinued     400 mg 200 mL/hr over 60 Minutes Intravenous Every 12 hours 08/25/17 1423 08/26/17 1517   08/25/17 1200  ciprofloxacin (CIPRO) IVPB 400 mg  Status:  Discontinued     400 mg 200 mL/hr over 60 Minutes Intravenous Every 12 hours 08/25/17 1008 08/25/17 1423   08/25/17 1200  metroNIDAZOLE (FLAGYL) IVPB 500 mg  Status:  Discontinued     500 mg 100 mL/hr over 60 Minutes Intravenous Every 8 hours 08/25/17 1008 08/26/17 1517   08/25/17 1030  ciprofloxacin (CIPRO) IVPB 400 mg  Status:  Discontinued     400 mg 200 mL/hr over 60 Minutes Intravenous  Once 08/25/17 1017 08/25/17 1423   08/25/17 1030  metroNIDAZOLE (FLAGYL) IVPB 500 mg   Status:  Discontinued     500 mg 100 mL/hr over 60 Minutes Intravenous  Once 08/25/17 1017 08/25/17 1423      Medications   Scheduled Meds: . enoxaparin (LOVENOX) injection  40 mg Subcutaneous Q24H  . methylPREDNISolone (SOLU-MEDROL) injection  60 mg Intravenous Q24H   Continuous Infusions: . dextrose 5 % and 0.45% NaCl 100 mL/hr at 08/27/17 0321  . famotidine (PEPCID) IV 20 mg (08/27/17 1124)   PRN Meds:.acetaminophen **OR** acetaminophen, HYDROcodone-acetaminophen, HYDROmorphone (DILAUDID) injection, ondansetron **OR** ondansetron (ZOFRAN) IV   Data Review:   Micro Results No results found for this or any previous visit (from the past 240 hour(s)).  Radiology Reports Dg Abd 1 View  Result Date: 08/25/2017 CLINICAL DATA:  Nasogastric tube placement EXAM: ABDOMEN - 1 VIEW COMPARISON:  Portable exam 1434 hours compared to 1029 hours FINDINGS: Nasogastric tube coiled in proximal stomach with tip projecting over pylorus/duodenal bulb. Contain contrast within mildly prominent RIGHT renal collecting system. Bibasilar atelectasis. Bowel gas pattern normal. IMPRESSION: Tip of nasogastric tube projects over pylorus or proximal duodenal bulb, consider withdrawal 5 cm. Electronically Signed   By: Lavonia Dana M.D.   On: 08/25/2017 14:59   Ct Abdomen Pelvis W Contrast  Result Date: 08/25/2017 CLINICAL DATA:  Abdominal distention. EXAM: CT ABDOMEN AND PELVIS WITH CONTRAST TECHNIQUE: Multidetector CT imaging of the abdomen and pelvis was performed using the standard protocol following bolus administration of intravenous contrast. CONTRAST:  139m ISOVUE-300 IOPAMIDOL (ISOVUE-300) INJECTION 61% COMPARISON:  10/02/2016 FINDINGS: Lower chest: No acute abnormality. Hepatobiliary: No focal liver abnormality is seen. No gallstones, gallbladder wall thickening, or biliary dilatation. Pancreas: Unremarkable. No pancreatic ductal dilatation or surrounding inflammatory changes. Spleen: Normal in size without  focal abnormality. Adrenals/Urinary Tract: The adrenal glands are normal. Small hypodensity within the interpolar left kidney measures 6 mm and is too small to characterize. Similarly there is a small low density structure in the upper pole of right kidney measuring 5 mm. No mass or hydronephrosis identified urinary bladder is negative. Stomach/Bowel: Small hiatal hernia. The proximal small bowel loops are unremarkable. The mid and distal small  bowel loops are abnormal in appearance. Loops measure up to 3.3 cm. There is marked wall thickening and inflammation involving the distal aspect of the terminal ileum with wall thickness measuring up to 1.3 cm. Here, there is significantly decreased caliber of the bowel lumen. Proximal to this there is abnormal increase caliber of the small bowel loops. The colon has a normal caliber. No significant wall thickening or inflammation involving the colon. Vascular/Lymphatic: Normal appearance of the abdominal aorta. No enlarged abdominal lymph nodes. Small bowel mesenteric node is prominent measuring 9 mm, likely reactive. Right lower quadrant ileocolic lymph nodes are prominent measuring up to 9 mm, image 53/2. Likely reactive. No iliac or inguinal adenopathy. Reproductive: Uterus and bilateral adnexa are unremarkable. Other: There is a moderate volume of free fluid identified within the pelvis. No discrete abscess identified. Musculoskeletal: Mild degenerative disc disease within the lower thoracic and lumbar spine. No suspicious bone lesions. IMPRESSION: 1. There is abnormal wall thickening and inflammation involving the terminal ileum compatible with ileitis. Likely inflammatory or infectious ileitis. Inflammatory bowel disease such as Crohn's disease is not excluded. Moderate free fluid noted within the pelvis, but no definite evidence for abscess or penetrating disease at this time. 2. Proximal to thickened and inflamed terminal ileum the mid and distal small bowel loops  are increased in caliber compatible with at least a partial small bowel obstruction. 3. Small hiatal hernia. Electronically Signed   By: Kerby Moors M.D.   On: 08/25/2017 09:08   Dg Abd Portable 1v  Result Date: 08/26/2017 CLINICAL DATA:  Status post NG tube placement. EXAM: PORTABLE ABDOMEN - 1 VIEW COMPARISON:  Plain film of the abdomen 08/25/2017. FINDINGS: NG tube has been pulled back with the tip now projecting in the distal body of the stomach near the antrum. No new abnormality. IMPRESSION: As above. Electronically Signed   By: Inge Rise M.D.   On: 08/26/2017 17:02   Dg Abd Portable 1 View  Result Date: 08/25/2017 CLINICAL DATA:  NG tube placement. EXAM: PORTABLE ABDOMEN - 1 VIEW COMPARISON:  CT 08/25/2017. FINDINGS: NG tube noted with tip below left hemidiaphragm. NG tube side hole noted the gastroesophageal junction. Tube advancement of approximately 8 cm should be considered. Mild right base subsegmental atelectasis. Contrast in the right kidney. IMPRESSION: NG tube noted with tip below left hemidiaphragm. NG tube side hole noted at the gastroesophageal junction. NG tube advancement of approximately 8 cm should be considered. Electronically Signed   By: Marcello Moores  Register   On: 08/25/2017 10:46     CBC Recent Labs  Lab 08/25/17 0735 08/25/17 1150 08/26/17 0357 08/27/17 0348  WBC 12.1* 19.2* 11.8* 7.6  HGB 11.7* 11.1* 10.5* 10.7*  HCT 36.2 33.9* 32.2* 33.1*  PLT 260 193 172 176  MCV 64.1* 63.2* 64.3* 64.3*  MCH 20.8* 20.7* 21.0* 20.8*  MCHC 32.4 32.7 32.6 32.3  RDW 15.8* 16.0* 16.2* 16.2*    Chemistries  Recent Labs  Lab 08/25/17 0735 08/25/17 1150 08/26/17 0357 08/27/17 0348  NA 139  --  137 137  K 4.1  --  3.5 4.0  CL 108  --  105 105  CO2 22  --  27 26  GLUCOSE 114*  --  113* 129*  BUN 13  --  9 10  CREATININE 0.55 0.59 0.62 0.75  CALCIUM 8.9  --  8.3* 8.7*  AST 18  --   --   --   ALT 15  --   --   --  ALKPHOS 145*  --   --   --   BILITOT 0.9  --    --   --    ------------------------------------------------------------------------------------------------------------------ estimated creatinine clearance is 89.8 mL/min (by C-G formula based on SCr of 0.75 mg/dL). ------------------------------------------------------------------------------------------------------------------ No results for input(s): HGBA1C in the last 72 hours. ------------------------------------------------------------------------------------------------------------------ No results for input(s): CHOL, HDL, LDLCALC, TRIG, CHOLHDL, LDLDIRECT in the last 72 hours. ------------------------------------------------------------------------------------------------------------------ No results for input(s): TSH, T4TOTAL, T3FREE, THYROIDAB in the last 72 hours.  Invalid input(s): FREET3 ------------------------------------------------------------------------------------------------------------------ Recent Labs    08/26/17 0357  VITAMINB12 223  FOLATE 6.0  FERRITIN 62  TIBC 277  IRON 11*    Coagulation profile No results for input(s): INR, PROTIME in the last 168 hours.  No results for input(s): DDIMER in the last 72 hours.  Cardiac Enzymes No results for input(s): CKMB, TROPONINI, MYOGLOBIN in the last 168 hours.  Invalid input(s): CK ------------------------------------------------------------------------------------------------------------------ Invalid input(s): [place]   Patient is 52 year old African-American female presenting with generalized abdominal pain  1.  Generalized abdominal pain due to terminal ileitis Patient's was ordered to have QuantiFERON gold to rule out TB ileitis however this test cannot be drawn until tomorrow because [place] to send away Continue antibiotics for now Patient improving Will start patient on a full liquid diet  2.  Chronic constipation will hold off on any medications for now  3.  Nicotine abuse  smoking cessation provided       Code Status Orders  (From admission, onward)        Start     Ordered   08/25/17 1150  Full code  Continuous     08/25/17 1149    Code Status History    Date Active Date Inactive Code Status Order ID Comments User Context   10/02/2016 2333 10/03/2016 2141 Full Code [id number]  Vickie Epley, MD ED           Consults GI and surgery  DVT Prophylaxis Lovenox  Lab Results  Component Value Date   PLT 176 08/27/2017     Time Spent in minutes 32 minutes greater than 50% of time spent in care coordination and counseling patient regarding the condition and plan of care.   Dustin Flock M.D on 08/27/2017 at 11:39 AM  Between 7am to 6pm - Pager - [phone]  After 6pm go to [email or web address] - Proofreader  [employer]   Office  [phone]

## 2017-08-27 NOTE — Progress Notes (Signed)
  Katelyn R Vanga, MD [address]  [address]  , [place] [address]  Main: [phone]  Fax: [phone] Pager: [phone]   Subjective: Small bowel obstruction resolved, patient had 2 soft bowel movements today. Tolerating full liquid diet. She denies nausea, vomiting, abdominal pain.NG tube has been removed. She is kept on airborne precautions because of the gold quantiferon test that was ordered. 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 She got upset when the moved her to isolation room  Objective: Vital signs in last 24 hours: Vitals:   08/26/17 1620 08/26/17 2022 08/27/17 0727 08/27/17 1352  BP: 140/70 131/72 (!) 143/72 120/76  Pulse: (!) 47 63 (!) 54 (!) 55  Resp: 16 18 18 18  Temp: 97.9 F (36.6 C) 98.4 F (36.9 C) 97.9 F (36.6 C) 98.1 F (36.7 C)  TempSrc: Oral Oral Oral Oral  SpO2: 98% 98% 99% 97%  Weight:      Height:       Weight change:   Intake/Output Summary (Last 24 hours) at 08/27/2017 1904 Last data filed at 08/27/2017 1353 Gross per 24 hour  Intake 1988.33 ml  Output 480 ml  Net 1508.33 ml     Exam: Heart:: Regular rate and rhythm or S1S2 present Lungs: clear to auscultation Abdomen: soft, nontender, normal bowel sounds   Lab Results: CBC Latest Ref Rng & Units 08/27/2017 08/26/2017 08/25/2017  WBC 3.6 - 11.0 K/uL 7.6 11.8(H) 19.2(H)  Hemoglobin 12.0 - 16.0 g/dL 10.7(L) 10.5(L) 11.1(L)  Hematocrit 35.0 - 47.0 % 33.1(L) 32.2(L) 33.9(L)  Platelets 150 - 440 K/uL 176 172 193   BMP Latest Ref Rng & Units 08/27/2017 08/26/2017 08/25/2017  Glucose 65 - 99 mg/dL 129(H) 113(H) -  BUN 6 - 20 mg/dL 10 9 -  Creatinine 0.44 - 1.00 mg/dL 0.75 0.62 0.59  Sodium 135 - 145 mmol/L 137 137 -  Potassium 3.5 - 5.1 mmol/L 4.0 3.5 -  Chloride 101 - 111 mmol/L 105 105 -  CO2 22 - 32 mmol/L 26 27 -  Calcium 8.9 - 10.3 mg/dL 8.7(L) 8.3(L) -   Hepatic  Function Latest Ref Rng & Units 08/25/2017 10/02/2016 02/23/2013  Total Protein 6.5 - 8.1 g/dL 7.5 8.4(H) 7.4  Albumin 3.5 - 5.0 g/dL 3.8 4.6 3.6  AST 15 - 41 U/L 18 21 29  ALT 14 - 54 U/L 15 13(L) 29  Alk Phosphatase 38 - 126 U/L 145(H) 134(H) 137(H)  Total Bilirubin 0.3 - 1.2 mg/dL 0.9 0.5 0.2   Micro Results: No results found for this or any previous visit (from the past 240 hour(s)). Studies/Results: Dg Abd Portable 1v  Result Date: 08/26/2017 CLINICAL DATA:  Status post NG tube placement. EXAM: PORTABLE ABDOMEN - 1 VIEW COMPARISON:  Plain film of the abdomen 08/25/2017. FINDINGS: NG tube has been pulled back with the tip now projecting in the distal body of the stomach near the antrum. No new abnormality. IMPRESSION: As above. Electronically Signed   By: Thomas  Dalessio M.D.   On: 08/26/2017 17:02   Medications: I have reviewed the patient's current medications. Scheduled Meds: . enoxaparin (LOVENOX) injection  40 mg Subcutaneous Q24H  . methylPREDNISolone (SOLU-MEDROL) injection  60 mg Intravenous Q24H  . nicotine  21 mg Transdermal Daily  . polyethylene glycol-electrolytes  4,000 mL Oral Once   Continuous Infusions: . dextrose 5 % and 0.45% NaCl 100   mL/hr at 08/27/17 1411  . famotidine (PEPCID) IV Stopped (08/27/17 1159)   PRN Meds:.acetaminophen **OR** acetaminophen, ALPRAZolam, HYDROcodone-acetaminophen, HYDROmorphone (DILAUDID) injection, ondansetron **OR** ondansetron (ZOFRAN) IV   Assessment: Active Problems:   Abdominal pain partial small bowel obstruction secondary to terminal ileitis, resolved Currently asymptomatic   Plan: Discontinued gold quantiferon test as patient had skin test performed annually and it has been negative, can remove air borne precautions Recommend colonoscopy tomorrow to evaluate for small bowel Crohn's, consider VCE if colonoscopy negative Currently on Solu-Medrol and empirically started for probable Crohn's. Discontinue if there is no  endoscopic evidence of Crohn's disease Clear liquid diet, nothing by mouth past midnight Bowel prep tonight  I have discussed alternative options, risks & benefits,  which include, but are not limited to, bleeding, infection, perforation,respiratory complication & drug reaction.  The patient agrees with this plan & written consent will be obtained.    Dr. Vicente Males will be performing the procedure tomorrow    LOS: 2 days   Katelyn Garrett 08/27/2017, 7:04 PM

## 2017-08-28 ENCOUNTER — Inpatient Hospital Stay: Payer: Managed Care, Other (non HMO) | Admitting: Anesthesiology

## 2017-08-28 ENCOUNTER — Encounter
Admission: EM | Disposition: A | Discharge status: Home / Self Care | Payer: Self-pay | Source: Home / Self Care | Attending: Internal Medicine

## 2017-08-28 ENCOUNTER — Encounter: Payer: Self-pay | Admitting: Anesthesiology

## 2017-08-28 HISTORY — PX: COLONOSCOPY: SHX5424

## 2017-08-28 LAB — BASIC METABOLIC PANEL
ANION GAP: 6 (ref 5–15)
BUN: 7 mg/dL (ref 6–20)
CALCIUM: 8.8 mg/dL — AB (ref 8.9–10.3)
CO2: 29 mmol/L (ref 22–32)
Chloride: 106 mmol/L (ref 101–111)
Creatinine, Ser: 0.61 mg/dL (ref 0.44–1.00)
GFR calc non Af Amer: >60 mL/min (ref >60)
Glucose, Bld: 127 mg/dL — ABNORMAL HIGH (ref 65–99)
Potassium: 3.5 mmol/L (ref 3.5–5.1)
Sodium: 141 mmol/L (ref 135–145)

## 2017-08-28 SURGERY — COLONOSCOPY
Anesthesia: General

## 2017-08-28 MED ORDER — HYDROCODONE-ACETAMINOPHEN 5-325 MG PO TABS: 1.0000 | ORAL_TABLET | ORAL | 0 refills | Status: DC | PRN

## 2017-08-28 MED ORDER — HYDRALAZINE HCL 20 MG/ML IJ SOLN
10.0000 mg | Freq: Four times a day (QID) | INTRAMUSCULAR | Status: DC | PRN
Start: 2017-08-28 — End: 2017-08-28
  Administered 2017-08-28: 13:00:00 10 mg via INTRAVENOUS
  Filled 2017-08-28: qty 1

## 2017-08-28 MED ORDER — CIPROFLOXACIN HCL 500 MG PO TABS: 500.0000 mg | ORAL_TABLET | Freq: Two times a day (BID) | ORAL | 0 refills | Status: AC

## 2017-08-28 MED ORDER — POLYETHYLENE GLYCOL 3350 17 G PO PACK: 17.0000 g | PACK | Freq: Every day | ORAL | 0 refills | Status: DC

## 2017-08-28 MED ORDER — GLYCOPYRROLATE 0.2 MG/ML IJ SOLN
INTRAMUSCULAR | Status: DC | PRN
  Administered 2017-08-28: 0.2 mg via INTRAVENOUS

## 2017-08-28 MED ORDER — METRONIDAZOLE 500 MG PO TABS: 500.0000 mg | ORAL_TABLET | Freq: Three times a day (TID) | ORAL | 0 refills | Status: AC

## 2017-08-28 MED ORDER — PROPOFOL 10 MG/ML IV BOLUS
INTRAVENOUS | Status: DC | PRN
  Administered 2017-08-28: 50 mg via INTRAVENOUS

## 2017-08-28 MED ORDER — SODIUM CHLORIDE 0.9 % IV SOLN
INTRAVENOUS | Status: DC | PRN
  Administered 2017-08-28: 11:00:00 via INTRAVENOUS

## 2017-08-28 MED ORDER — PROPOFOL 500 MG/50ML IV EMUL
INTRAVENOUS | Status: DC | PRN
  Administered 2017-08-28: 140 ug/kg/min via INTRAVENOUS

## 2017-08-28 MED ORDER — PREDNISONE 10 MG PO TABS: 40.0000 mg | ORAL_TABLET | Freq: Every day | ORAL | 0 refills | Status: DC

## 2017-08-28 NOTE — Op Note (Signed)
[facility] Gastroenterology Patient Name: Katelyn Garrett Procedure Date: 08/28/2017 10:50 AM MRN: [id number] Account #: [id number] Date of Birth: 1965-05-13 Admit Type: Inpatient Age: 52 Room: [facility] ENDO ROOM 4 Gender: Female Note Status: Finalized Procedure:            Colonoscopy Indications:          Abnormal CT of the GI tract Providers:            Jonathon Bellows MD, MD Referring MD:         No Local Md, MD (Referring MD) Medicines:            Monitored Anesthesia Care Complications:        No immediate complications. Procedure:            Pre-Anesthesia Assessment:                       - Prior to the procedure, a History and Physical was                        performed, and patient medications, allergies and                        sensitivities were reviewed. The patient's tolerance of                        previous anesthesia was reviewed.                       - The risks and benefits of the procedure and the                        sedation options and risks were discussed with the                        patient. All questions were answered and informed                        consent was obtained.                       - ASA Grade Assessment: III - A patient with severe                        systemic disease.                       After obtaining informed consent, the colonoscope was                        passed under direct vision. Throughout the procedure,                        the patient's blood pressure, pulse, and oxygen                        saturations were monitored continuously. The                        Colonoscope was introduced through the anus and  advanced to the the cecum, identified by the ileocecal                        valve. The colonoscopy was performed with ease. The                        patient tolerated the procedure well. The quality of                        the bowel preparation was fair. Findings:       The perianal and digital rectal examinations were normal.      Multiple small-mouthed diverticula were found in the sigmoid colon.      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 Biopsies were taken with a cold       forceps for histology. Acute inflammation seen at the IC valve      Discontinuous areas of nonbleeding ulcerated mucosa with no stigmata of       recent bleeding were present in the cecum. Biopsies were taken with a       cold forceps for histology. The ulcers were tiny and apthous in       appearance , most pronounced in the cecum      Discontinuous areas of nonbleeding ulcerated mucosa with no stigmata of       recent bleeding were present in the ascending colon. Biopsies were taken       with a cold forceps for histology. The ulceration was very minimal      The exam was otherwise without abnormality on direct and retroflexion       views. Impression:           - Preparation of the colon was fair.                       - Diverticulosis in the sigmoid colon.                       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                       - Mucosal ulceration. Biopsied.                       - Mucosal ulceration. Biopsied.                       - The examination was otherwise normal on direct and                        retroflexion views. Recommendation:       - Return patient to hospital ward for ongoing care.                       - 1. Advance diet as tolerated- low fiber/residue diet                       2. Continue 7 days of ciprofloxacin and flagul                       3. Continue steroids prednisone 40 mg  once a day and                        decrease by 5 mg every week                       4. Suggest follow up with Dr Marius Ditch as an outpatient                       5. Outpatient MR enterogram to determine the length of                        the stricture and if isolated or multi focal .                         Depending on the biopsy result next steps of treatment                        can be decided. At this point Video capsule study is                        contraindicated.                       6. No NSAID's                       GI will sign out please call with questions Procedure Code(s):    --- Professional ---                       [phone], Colonoscopy, flexible; with biopsy, single or                        multiple Diagnosis Code(s):    --- Professional ---                       P37.902, Other intestinal obstruction unspecified as to                        partial versus complete obstruction                       K63.3, Ulcer of intestine                       K57.30, Diverticulosis of large intestine without                        perforation or abscess without bleeding                       R93.3, Abnormal findings on diagnostic imaging of other                        parts of digestive tract CPT copyright 2017 American Medical Association. All rights reserved. The codes documented in this report are preliminary and upon coder review may  be revised to meet current compliance requirements. Jonathon Bellows, MD Jonathon Bellows MD, MD 08/28/2017 11:27:00 AM This report has been signed electronically. Number of Addenda: 0 Note Initiated On: 08/28/2017 10:50 AM Scope Withdrawal Time: 0 hours 11 minutes  51 seconds  Total Procedure Duration: 0 hours 14 minutes 9 seconds       [facility]

## 2017-08-28 NOTE — Plan of Care (Signed)
  Problem: Education: Goal: Knowledge of General Education information will improve Outcome: Progressing   Problem: Health Behavior/Discharge Planning: Goal: Ability to manage health-related needs will improve Outcome: Progressing   Problem: Clinical Measurements: Goal: Ability to maintain clinical measurements within normal limits will improve Outcome: Progressing   Problem: Elimination: Goal: Will not experience complications related to bowel motility Outcome: Progressing   Problem: Pain Managment: Goal: General experience of comfort will improve Outcome: Progressing   Problem: Safety: Goal: Ability to remain free from injury will improve Outcome: Progressing

## 2017-08-28 NOTE — Anesthesia Post-op Follow-up Note (Signed)
Anesthesia QCDR form completed.        

## 2017-08-28 NOTE — Progress Notes (Signed)
Received call from [facility] and was asked to clarify order for prednisone. MD Posey Pronto paged and by MD, start prednisone 40 mg for the first week and decrease by 5 mg each week  until last week of taking 5 mg each day and then stop taking. Patient will be taking this med for 8 weeks. Pharmacy was called back and order was clarify with them.

## 2017-08-28 NOTE — Transfer of Care (Signed)
Immediate Anesthesia Transfer of Care Note  Patient: Katelyn Garrett  Procedure(s) Performed: COLONOSCOPY (N/A )  Patient Location: PACU  Anesthesia Type:General  Level of Consciousness: awake and sedated  Airway & Oxygen Therapy: Patient Spontanous Breathing and Patient connected to nasal cannula oxygen  Post-op Assessment: Report given to RN and Post -op Vital signs reviewed and stable  Post vital signs: Reviewed and stable  Last Vitals:  Vitals Value Taken Time  BP    Temp    Pulse 84 08/28/2017 11:29 AM  Resp    SpO2 100 % 08/28/2017 11:29 AM  Vitals shown include unvalidated device data.  Last Pain:  Vitals:   08/28/17 0413  TempSrc: Oral  PainSc:       Patients Stated Pain Goal: 3 (21/58/72 7618)  Complications: No apparent anesthesia complications

## 2017-08-28 NOTE — Anesthesia Postprocedure Evaluation (Signed)
Anesthesia Post Note  Patient: Katelyn Garrett  Procedure(s) Performed: COLONOSCOPY (N/A )  Patient location during evaluation: Endoscopy Anesthesia Type: General Level of consciousness: awake and alert Pain management: pain level controlled Vital Signs Assessment: post-procedure vital signs reviewed and stable Respiratory status: spontaneous breathing, nonlabored ventilation, respiratory function stable and patient connected to nasal cannula oxygen Cardiovascular status: blood pressure returned to baseline and stable Postop Assessment: no apparent nausea or vomiting Anesthetic complications: no     Last Vitals:  Vitals:   08/28/17 1225 08/28/17 1249  BP: (!) 185/114 (!) 163/97  Pulse: 96 88  Resp: 18   Temp: 36.5 C   SpO2: 99%     Last Pain:  Vitals:   08/28/17 1225  TempSrc: Oral  PainSc: 3                  Martha Clan

## 2017-08-28 NOTE — Progress Notes (Signed)
Discharge instructions and medication details reviewed with patient. All questions answered. Printed prescriptions given to patient as well as printed AVS and work note. IV removed. Patient was escorted out via wheelchair.

## 2017-08-28 NOTE — Anesthesia Procedure Notes (Signed)
Date/Time: 08/28/2017 11:09 AM Performed by: Nelda Marseille, CRNA Pre-anesthesia Checklist: Patient identified, Emergency Drugs available, Suction available, Patient being monitored and Timeout performed Oxygen Delivery Method: Nasal cannula

## 2017-08-28 NOTE — Discharge Summary (Signed)
[facility] - [place] at [facility], 52 y.o., DOB 01-30-1966, MRN [id number]. 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  Patient had NG tube placed with some output.  However NG tube was in the duodenum therefore she had a lot more output.  Surgery saw that and remove the NG tube.  She was treated with IV antibiotics and steroids with significant improvement in her symptoms.  She underwent endoscopy which showed a stricture in the iliac region.  Had that biopsied.  GI recommends outpatient follow-up.  Recommended prednisone therapy also antibiotics.  Further imaging may be needed.           Consults  GI  Significant Tests:  See full reports for all details     Dg Abd 1 View  Result Date: 08/25/2017 CLINICAL DATA:  Nasogastric tube placement EXAM: ABDOMEN - 1 VIEW COMPARISON:  Portable exam 1434 hours compared to 1029 hours FINDINGS: Nasogastric tube coiled in proximal stomach with tip projecting over pylorus/duodenal bulb. Contain contrast within mildly prominent RIGHT renal collecting system. Bibasilar atelectasis. Bowel gas pattern normal. IMPRESSION: Tip of nasogastric tube projects over pylorus or proximal duodenal bulb, consider withdrawal 5 cm. Electronically Signed   By: Lavonia Dana M.D.   On: 08/25/2017 14:59   Ct Abdomen Pelvis W Contrast  Result Date: 08/25/2017 CLINICAL DATA:   Abdominal distention. EXAM: CT ABDOMEN AND PELVIS WITH CONTRAST TECHNIQUE: Multidetector CT imaging of the abdomen and pelvis was performed using the standard protocol following bolus administration of intravenous contrast. CONTRAST:  167m ISOVUE-300 IOPAMIDOL (ISOVUE-300) INJECTION 61% COMPARISON:  10/02/2016 FINDINGS: Lower chest: No acute abnormality. Hepatobiliary: No focal liver abnormality is seen. No gallstones, gallbladder wall thickening, or biliary dilatation. Pancreas: Unremarkable. No pancreatic ductal dilatation or surrounding inflammatory changes. Spleen: Normal in size without focal abnormality. Adrenals/Urinary Tract: The adrenal glands are normal. Small hypodensity within the interpolar left kidney measures 6 mm and is too small to characterize. Similarly there is a small low density structure in the upper pole of right kidney measuring 5 mm. No mass or hydronephrosis identified urinary bladder is negative. Stomach/Bowel: Small hiatal hernia. The proximal small bowel loops are unremarkable. The mid and distal small bowel loops are abnormal in appearance. Loops measure up to 3.3 cm. There is marked wall thickening and inflammation involving the distal aspect of the terminal ileum with wall thickness measuring up to 1.3 cm. Here, there is significantly decreased caliber of the bowel lumen. Proximal to this there is abnormal increase caliber of the small bowel loops. The colon has a normal caliber. No significant wall thickening or inflammation involving the colon. Vascular/Lymphatic: Normal appearance of the abdominal aorta. No enlarged abdominal lymph nodes. Small bowel mesenteric node is prominent measuring 9 mm, likely reactive. Right lower quadrant ileocolic lymph nodes are prominent measuring up to 9 mm, image 53/2. Likely reactive. No iliac or inguinal adenopathy. Reproductive: Uterus and bilateral adnexa are unremarkable. Other: There is a moderate volume of free fluid identified within the  pelvis. No discrete abscess identified. Musculoskeletal: Mild degenerative disc disease within the lower thoracic and lumbar spine. No suspicious bone lesions. IMPRESSION: 1. There is abnormal wall thickening and inflammation involving the terminal ileum compatible with ileitis. Likely inflammatory or infectious ileitis. Inflammatory bowel disease such as Crohn's disease is not excluded. Moderate free fluid noted within the pelvis, but no definite evidence for abscess or penetrating disease at this time. 2. Proximal to thickened and inflamed terminal ileum the mid and distal small bowel loops are increased in caliber compatible with at least a partial small bowel obstruction. 3. Small hiatal hernia. Electronically Signed   By: Kerby Moors M.D.   On: 08/25/2017 09:08   Dg Abd Portable 1v  Result Date: 08/26/2017 CLINICAL DATA:  Status post NG tube placement. EXAM: PORTABLE ABDOMEN - 1 VIEW COMPARISON:  Plain film of the abdomen 08/25/2017. FINDINGS: NG tube has been pulled back with the tip now projecting in the distal body of the stomach near the antrum. No new abnormality. IMPRESSION: As above. Electronically Signed   By: Inge Rise M.D.   On: 08/26/2017 17:02   Dg Abd Portable 1 View  Result Date: 08/25/2017 CLINICAL DATA:  NG tube placement. EXAM: PORTABLE ABDOMEN - 1 VIEW COMPARISON:  CT 08/25/2017. FINDINGS: NG tube noted with tip below left hemidiaphragm. NG tube side hole noted the gastroesophageal junction. Tube advancement of approximately 8 cm should be considered. Mild right base subsegmental atelectasis. Contrast in the right kidney. IMPRESSION: NG tube noted with tip below left hemidiaphragm. NG tube side hole noted at the gastroesophageal junction. NG tube advancement of approximately 8 cm should be considered. Electronically Signed   By: Marcello Moores  Register   On: 08/25/2017 10:46       Today   Subjective:   Katelyn Garrett patient's abdominal pain resolved  Objective:   Blood  pressure 122/71, pulse (!) 56, temperature 98 F (36.7 C), temperature source Oral, resp. rate 18, height 5' 7"  (1.702 m), weight 74.8 kg (164 lb 14.4 oz), SpO2 100 %.  .  Intake/Output Summary (Last 24 hours) at 08/28/2017 1413 Last data filed at 08/28/2017 1121 Gross per 24 hour  Intake 1961.66 ml  Output -  Net 1961.66 ml    Exam VITAL SIGNS: Blood pressure 122/71, pulse (!) 56, temperature 98 F (36.7 C), temperature source Oral, resp. rate 18, height 5' 7"  (1.702 m), weight 74.8 kg (164 lb 14.4 oz), SpO2 100 %.  GENERAL:  52 y.o.-year-old patient lying in the bed with no acute distress.  EYES: Pupils equal, round, reactive to light and accommodation. No scleral icterus. Extraocular muscles intact.  HEENT: Head atraumatic, normocephalic. Oropharynx and nasopharynx clear.  NECK:  Supple, no jugular venous distention. No thyroid enlargement, no tenderness.  LUNGS: Normal breath sounds bilaterally, no wheezing, rales,rhonchi or crepitation. No use of accessory muscles of respiration.  CARDIOVASCULAR: S1, S2 normal. No murmurs, rubs, or gallops.  ABDOMEN: Soft, nontender, nondistended. Bowel sounds present. No organomegaly or mass.  EXTREMITIES: No pedal edema, cyanosis, or clubbing.  NEUROLOGIC: Cranial nerves II through XII are intact. Muscle strength 5/5 in all extremities. Sensation intact. Gait not checked.  PSYCHIATRIC: The patient is alert and oriented x 3.  SKIN: No obvious rash, lesion, or ulcer.   Data Review     CBC w Diff:  Lab Results  Component Value Date   WBC 7.6 08/27/2017   HGB 10.7 (L) 08/27/2017   HGB 12.0 02/23/2013   HCT 33.1 (L) 08/27/2017  HCT 37.5 02/23/2013   PLT 176 08/27/2017   PLT 219 02/23/2013   LYMPHOPCT 34 10/03/2016   LYMPHOPCT 29.8 02/14/2012   MONOPCT 7 10/03/2016   MONOPCT 9.1 02/14/2012   EOSPCT 3 10/03/2016   EOSPCT 1.4 02/14/2012   BASOPCT 1 10/03/2016   BASOPCT 1.3 02/14/2012   CMP:  Lab Results  Component Value Date   NA  141 08/28/2017   NA 140 02/23/2013   K 3.5 08/28/2017   K 3.1 (L) 02/23/2013   CL 106 08/28/2017   CL 105 02/23/2013   CO2 29 08/28/2017   CO2 28 02/23/2013   BUN 7 08/28/2017   BUN 9 02/23/2013   CREATININE 0.61 08/28/2017   CREATININE 0.69 02/23/2013   PROT 7.5 08/25/2017   PROT 7.4 02/23/2013   ALBUMIN 3.8 08/25/2017   ALBUMIN 3.6 02/23/2013   BILITOT 0.9 08/25/2017   BILITOT 0.2 02/23/2013   ALKPHOS 145 (H) 08/25/2017   ALKPHOS 137 (H) 02/23/2013   AST 18 08/25/2017   AST 29 02/23/2013   ALT 15 08/25/2017   ALT 29 02/23/2013  .  Micro Results No results found for this or any previous visit (from the past 240 hour(s)).      Code Status Orders  (From admission, onward)        Start     Ordered   08/25/17 1150  Full code  Continuous     08/25/17 1149    Code Status History    Date Active Date Inactive Code Status Order ID Comments User Context   10/02/2016 2333 10/03/2016 2141 Full Code [id number]  Vickie Epley, MD ED          Follow-up Information    Lin Landsman, MD Follow up in 2 week(s).   Specialty:  Gastroenterology Why:  hosp f/u Contact information: [place] [address] [phone]        pcp Follow up in 1 week(s).           Discharge Medications   Allergies as of 08/28/2017   No Known Allergies     Medication List    TAKE these medications   ciprofloxacin 500 MG tablet Commonly known as:  CIPRO Take 1 tablet (500 mg total) by mouth 2 (two) times daily for 7 days.   HYDROcodone-acetaminophen 5-325 MG tablet Commonly known as:  NORCO/VICODIN Take 1-2 tablets by mouth every 4 (four) hours as needed for moderate pain.   metroNIDAZOLE 500 MG tablet Commonly known as:  FLAGYL Take 1 tablet (500 mg total) by mouth 3 (three) times daily for 7 days.   polyethylene glycol packet Commonly known as:  MIRALAX Take 17 g by mouth daily.   predniSONE 10 MG tablet Commonly known as:  DELTASONE Take  4 tablets (40 mg total) by mouth daily. As directed see prescription          Total Time in preparing paper work, data evaluation and todays exam - 26 minutes  Dustin Flock M.D on 08/28/2017 at 2:13 PM [place]  [phone]

## 2017-08-28 NOTE — Care Management (Signed)
Spoke with Katelyn Garrett at the bedside. States she just started a new job. Has [employer]. Physician's accepting new patients information.given Katelyn Ammons RN MSN CCM Care Management [phone]

## 2017-08-28 NOTE — Progress Notes (Signed)
Katelyn Garrett at [place] was admitted to the Hospital on 08/25/2017 and Discharged  08/28/2017 and should be excused from work/school   for7 days starting 08/25/2017 , may return to work/school without any restrictions.  Call Dustin Flock MD with questions.  Dustin Flock M.D on 08/28/2017,at 9:19 AM  [place] at [facility]  [phone]

## 2017-08-28 NOTE — Progress Notes (Signed)
[place] at [facility]                                                                                                                                                                                  Patient Demographics   Katelyn Garrett, is a 52 y.o. female, DOB - 1965/11/10, ZOX:[id number]  Admit date - 08/25/2017   Admitting Physician Dustin Flock, MD  Outpatient Primary MD for the patient is Patient, No Pcp Per   LOS - 3  Subjective: Patient denies any complaints awaiting colonoscopy  Review of Systems:   CONSTITUTIONAL: No documented fever. No fatigue, weakness. No weight gain, no weight loss.  EYES: No blurry or double vision.  ENT: No tinnitus. No postnasal drip. No redness of the oropharynx.  RESPIRATORY: No cough, no wheeze, no hemoptysis. No dyspnea.  CARDIOVASCULAR: No chest pain. No orthopnea. No palpitations. No syncope.  GASTROINTESTINAL: No nausea, no vomiting or diarrhea.  No abdominal pain. No melena or hematochezia.  GENITOURINARY: No dysuria or hematuria.  ENDOCRINE: No polyuria or nocturia. No heat or cold intolerance.  HEMATOLOGY: No anemia. No bruising. No bleeding.  INTEGUMENTARY: No rashes. No lesions.  MUSCULOSKELETAL: No arthritis. No swelling. No gout.  NEUROLOGIC: No numbness, tingling, or ataxia. No seizure-type activity.  PSYCHIATRIC: No anxiety. No insomnia. No ADD.    Vitals:   Vitals:   08/27/17 2004 08/28/17 0413 08/28/17 0458 08/28/17 1130  BP: (!) 143/85 (!) 174/82 (!) 152/81 (!) 158/90  Pulse: (!) 57 (!) 41 (!) 55   Resp: 18 18  20   Temp: 98.4 F (36.9 C) 97.9 F (36.6 C)    TempSrc: Oral Oral    SpO2: 100% 100%    Weight:      Height:        Wt Readings from Last 3 Encounters:  08/25/17 74.8 kg (164 lb 14.4 oz)  10/03/16 79.9 kg (176 lb 1.6 oz)  02/05/16 63.5 kg (140 lb)     Intake/Output Summary (Last 24 hours) at 08/28/2017 1207 Last data filed at 08/28/2017 1121 Gross per 24 hour  Intake  2554.99 ml  Output -  Net 2554.99 ml    Physical Exam:   GENERAL: Pleasant-appearing in no apparent distress.  HEAD, EYES, EARS, NOSE AND THROAT: Atraumatic, normocephalic. Extraocular muscles are intact. Pupils equal and reactive to light. Sclerae anicteric. No conjunctival injection. No oro-pharyngeal erythema.  NECK: Supple. There is no jugular venous distention. No bruits, no lymphadenopathy, no thyromegaly.  HEART: Regular rate and rhythm,. No murmurs, no rubs, no clicks.  LUNGS: Clear to auscultation bilaterally. No rales or rhonchi. No wheezes.  ABDOMEN: Soft, flat, nontender, nondistended. Has good bowel  sounds. No hepatosplenomegaly appreciated.  EXTREMITIES: No evidence of any cyanosis, clubbing, or peripheral edema.  +2 pedal and radial pulses bilaterally.  NEUROLOGIC: The patient is alert, awake, and oriented x3 with no focal motor or sensory deficits appreciated bilaterally.  SKIN: Moist and warm with no rashes appreciated.  Psych: Not anxious, depressed LN: No inguinal LN enlargement    Antibiotics   Anti-infectives (From admission, onward)   Start     Dose/Rate Route Frequency Ordered Stop   08/25/17 1500  ciprofloxacin (CIPRO) IVPB 400 mg  Status:  Discontinued     400 mg 200 mL/hr over 60 Minutes Intravenous Every 12 hours 08/25/17 1423 08/26/17 1517   08/25/17 1200  ciprofloxacin (CIPRO) IVPB 400 mg  Status:  Discontinued     400 mg 200 mL/hr over 60 Minutes Intravenous Every 12 hours 08/25/17 1008 08/25/17 1423   08/25/17 1200  metroNIDAZOLE (FLAGYL) IVPB 500 mg  Status:  Discontinued     500 mg 100 mL/hr over 60 Minutes Intravenous Every 8 hours 08/25/17 1008 08/26/17 1517   08/25/17 1030  ciprofloxacin (CIPRO) IVPB 400 mg  Status:  Discontinued     400 mg 200 mL/hr over 60 Minutes Intravenous  Once 08/25/17 1017 08/25/17 1423   08/25/17 1030  metroNIDAZOLE (FLAGYL) IVPB 500 mg  Status:  Discontinued     500 mg 100 mL/hr over 60 Minutes Intravenous  Once  08/25/17 1017 08/25/17 1423      Medications   Scheduled Meds: . [MAR Hold] enoxaparin (LOVENOX) injection  40 mg Subcutaneous Q24H  . [MAR Hold] methylPREDNISolone (SOLU-MEDROL) injection  60 mg Intravenous Q24H  . [MAR Hold] nicotine  21 mg Transdermal Daily   Continuous Infusions: . dextrose 5 % and 0.45% NaCl 100 mL/hr at 08/28/17 0038  . [MAR Hold] famotidine (PEPCID) IV 20 mg (08/28/17 0921)   PRN Meds:.[MAR Hold] acetaminophen **OR** [MAR Hold] acetaminophen, [MAR Hold] ALPRAZolam, [MAR Hold] HYDROcodone-acetaminophen, [MAR Hold]  HYDROmorphone (DILAUDID) injection, [MAR Hold] ondansetron **OR** [MAR Hold] ondansetron (ZOFRAN) IV   Data Review:   Micro Results No results found for this or any previous visit (from the past 240 hour(s)).  Radiology Reports Dg Abd 1 View  Result Date: 08/25/2017 CLINICAL DATA:  Nasogastric tube placement EXAM: ABDOMEN - 1 VIEW COMPARISON:  Portable exam 1434 hours compared to 1029 hours FINDINGS: Nasogastric tube coiled in proximal stomach with tip projecting over pylorus/duodenal bulb. Contain contrast within mildly prominent RIGHT renal collecting system. Bibasilar atelectasis. Bowel gas pattern normal. IMPRESSION: Tip of nasogastric tube projects over pylorus or proximal duodenal bulb, consider withdrawal 5 cm. Electronically Signed   By: Lavonia Dana M.D.   On: 08/25/2017 14:59   Ct Abdomen Pelvis W Contrast  Result Date: 08/25/2017 CLINICAL DATA:  Abdominal distention. EXAM: CT ABDOMEN AND PELVIS WITH CONTRAST TECHNIQUE: Multidetector CT imaging of the abdomen and pelvis was performed using the standard protocol following bolus administration of intravenous contrast. CONTRAST:  137m ISOVUE-300 IOPAMIDOL (ISOVUE-300) INJECTION 61% COMPARISON:  10/02/2016 FINDINGS: Lower chest: No acute abnormality. Hepatobiliary: No focal liver abnormality is seen. No gallstones, gallbladder wall thickening, or biliary dilatation. Pancreas: Unremarkable. No  pancreatic ductal dilatation or surrounding inflammatory changes. Spleen: Normal in size without focal abnormality. Adrenals/Urinary Tract: The adrenal glands are normal. Small hypodensity within the interpolar left kidney measures 6 mm and is too small to characterize. Similarly there is a small low density structure in the upper pole of right kidney measuring 5 mm. No mass or hydronephrosis identified urinary  bladder is negative. Stomach/Bowel: Small hiatal hernia. The proximal small bowel loops are unremarkable. The mid and distal small bowel loops are abnormal in appearance. Loops measure up to 3.3 cm. There is marked wall thickening and inflammation involving the distal aspect of the terminal ileum with wall thickness measuring up to 1.3 cm. Here, there is significantly decreased caliber of the bowel lumen. Proximal to this there is abnormal increase caliber of the small bowel loops. The colon has a normal caliber. No significant wall thickening or inflammation involving the colon. Vascular/Lymphatic: Normal appearance of the abdominal aorta. No enlarged abdominal lymph nodes. Small bowel mesenteric node is prominent measuring 9 mm, likely reactive. Right lower quadrant ileocolic lymph nodes are prominent measuring up to 9 mm, image 53/2. Likely reactive. No iliac or inguinal adenopathy. Reproductive: Uterus and bilateral adnexa are unremarkable. Other: There is a moderate volume of free fluid identified within the pelvis. No discrete abscess identified. Musculoskeletal: Mild degenerative disc disease within the lower thoracic and lumbar spine. No suspicious bone lesions. IMPRESSION: 1. There is abnormal wall thickening and inflammation involving the terminal ileum compatible with ileitis. Likely inflammatory or infectious ileitis. Inflammatory bowel disease such as Crohn's disease is not excluded. Moderate free fluid noted within the pelvis, but no definite evidence for abscess or penetrating disease at this  time. 2. Proximal to thickened and inflamed terminal ileum the mid and distal small bowel loops are increased in caliber compatible with at least a partial small bowel obstruction. 3. Small hiatal hernia. Electronically Signed   By: Kerby Moors M.D.   On: 08/25/2017 09:08   Dg Abd Portable 1v  Result Date: 08/26/2017 CLINICAL DATA:  Status post NG tube placement. EXAM: PORTABLE ABDOMEN - 1 VIEW COMPARISON:  Plain film of the abdomen 08/25/2017. FINDINGS: NG tube has been pulled back with the tip now projecting in the distal body of the stomach near the antrum. No new abnormality. IMPRESSION: As above. Electronically Signed   By: Inge Rise M.D.   On: 08/26/2017 17:02   Dg Abd Portable 1 View  Result Date: 08/25/2017 CLINICAL DATA:  NG tube placement. EXAM: PORTABLE ABDOMEN - 1 VIEW COMPARISON:  CT 08/25/2017. FINDINGS: NG tube noted with tip below left hemidiaphragm. NG tube side hole noted the gastroesophageal junction. Tube advancement of approximately 8 cm should be considered. Mild right base subsegmental atelectasis. Contrast in the right kidney. IMPRESSION: NG tube noted with tip below left hemidiaphragm. NG tube side hole noted at the gastroesophageal junction. NG tube advancement of approximately 8 cm should be considered. Electronically Signed   By: Marcello Moores  Register   On: 08/25/2017 10:46     CBC Recent Labs  Lab 08/25/17 0735 08/25/17 1150 08/26/17 0357 08/27/17 0348  WBC 12.1* 19.2* 11.8* 7.6  HGB 11.7* 11.1* 10.5* 10.7*  HCT 36.2 33.9* 32.2* 33.1*  PLT 260 193 172 176  MCV 64.1* 63.2* 64.3* 64.3*  MCH 20.8* 20.7* 21.0* 20.8*  MCHC 32.4 32.7 32.6 32.3  RDW 15.8* 16.0* 16.2* 16.2*    Chemistries  Recent Labs  Lab 08/25/17 0735 08/25/17 1150 08/26/17 0357 08/27/17 0348 08/28/17 0358  NA 139  --  137 137 141  K 4.1  --  3.5 4.0 3.5  CL 108  --  105 105 106  CO2 22  --  27 26 29   GLUCOSE 114*  --  113* 129* 127*  BUN 13  --  9 10 7   CREATININE 0.55 0.59 0.62  0.75 0.61  CALCIUM  8.9  --  8.3* 8.7* 8.8*  AST 18  --   --   --   --   ALT 15  --   --   --   --   ALKPHOS 145*  --   --   --   --   BILITOT 0.9  --   --   --   --    ------------------------------------------------------------------------------------------------------------------ estimated creatinine clearance is 89.8 mL/min (by C-G formula based on SCr of 0.61 mg/dL). ------------------------------------------------------------------------------------------------------------------ No results for input(s): HGBA1C in the last 72 hours. ------------------------------------------------------------------------------------------------------------------ No results for input(s): CHOL, HDL, LDLCALC, TRIG, CHOLHDL, LDLDIRECT in the last 72 hours. ------------------------------------------------------------------------------------------------------------------ No results for input(s): TSH, T4TOTAL, T3FREE, THYROIDAB in the last 72 hours.  Invalid input(s): FREET3 ------------------------------------------------------------------------------------------------------------------ Recent Labs    08/26/17 0357  VITAMINB12 223  FOLATE 6.0  FERRITIN 62  TIBC 277  IRON 11*    Coagulation profile No results for input(s): INR, PROTIME in the last 168 hours.  No results for input(s): DDIMER in the last 72 hours.  Cardiac Enzymes No results for input(s): CKMB, TROPONINI, MYOGLOBIN in the last 168 hours.  Invalid input(s): CK ------------------------------------------------------------------------------------------------------------------ Invalid input(s): [place]   Patient is 52 year old African-American female presenting with generalized abdominal pain  1.  Generalized abdominal pain due to terminal ileitis Plan for colonoscopy today Continue antibiotics for now If colonoscopy negative can be discharged home if okay with GI  2.  Chronic constipation will hold off  on any medications for now will need MiraLAX on discharge  3.  Nicotine abuse smoking cessation provided       Code Status Orders  (From admission, onward)        Start     Ordered   08/25/17 1150  Full code  Continuous     08/25/17 1149    Code Status History    Date Active Date Inactive Code Status Order ID Comments User Context   10/02/2016 2333 10/03/2016 2141 Full Code [id number]  Vickie Epley, MD ED           Consults GI and surgery  DVT Prophylaxis Lovenox  Lab Results  Component Value Date   PLT 176 08/27/2017     Time Spent in minutes 32 minutes greater than 50% of time spent in care coordination and counseling patient regarding the condition and plan of care.   Dustin Flock M.D on 08/28/2017 at 12:07 PM  Between 7am to 6pm - Pager - [phone]  After 6pm go to [email or web address] - Proofreader  [employer]   Office  [phone]

## 2017-08-28 NOTE — Anesthesia Preprocedure Evaluation (Signed)
Anesthesia Evaluation  Patient identified by MRN, date of birth, ID band Patient awake    Reviewed: Allergy & Precautions, H&P , NPO status , Patient's Chart, lab work & pertinent test results, reviewed documented beta blocker date and time   History of Anesthesia Complications Negative for: history of anesthetic complications  Airway Mallampati: III  TM Distance: >3 FB Neck ROM: full    Dental  (+) Dental Advidsory Given, Missing   Pulmonary neg shortness of breath, neg sleep apnea, neg COPD, neg recent URI, Current Smoker,           Cardiovascular Exercise Tolerance: Good negative cardio ROS       Neuro/Psych negative neurological ROS  negative psych ROS   GI/Hepatic negative GI ROS, Neg liver ROS,   Endo/Other  negative endocrine ROS  Renal/GU negative Renal ROS  negative genitourinary   Musculoskeletal   Abdominal   Peds  Hematology negative hematology ROS (+)   Anesthesia Other Findings Past Medical History: No date: Chronic constipation   Reproductive/Obstetrics negative OB ROS                             Anesthesia Physical Anesthesia Plan  ASA: I  Anesthesia Plan: General   Post-op Pain Management:    Induction: Intravenous  PONV Risk Score and Plan: 2 and Propofol infusion  Airway Management Planned: Natural Airway and Nasal Cannula  Additional Equipment:   Intra-op Plan:   Post-operative Plan:   Informed Consent: I have reviewed the patients History and Physical, chart, labs and discussed the procedure including the risks, benefits and alternatives for the proposed anesthesia with the patient or authorized representative who has indicated his/her understanding and acceptance.   Dental Advisory Given  Plan Discussed with: Anesthesiologist, CRNA and Surgeon  Anesthesia Plan Comments:         Anesthesia Quick Evaluation

## 2017-08-28 NOTE — H&P (Signed)
Jonathon Bellows, MD [address] [place], [place], [place], [address] [address], [place], [place], [place], [address] Phone: [phone]  Fax: [phone]  Primary Care Physician:  Patient, No Pcp Per   Pre-Procedure History & Physical: HPI:  Katelyn Garrett is a 52 y.o. female is here for an colonoscopy.   Past Medical History:  Diagnosis Date  . Chronic constipation     Past Surgical History:  Procedure Laterality Date  . TUBAL LIGATION      Prior to Admission medications   Not on File    Allergies as of 08/25/2017  . (No Known Allergies)    History reviewed. No pertinent family history.  Social History   Socioeconomic History  . Marital status: Single    Spouse name: Not on file  . Number of children: Not on file  . Years of education: Not on file  . Highest education level: Not on file  Occupational History  . Not on file  Social Needs  . Financial resource strain: Not on file  . Food insecurity:    Worry: Not on file    Inability: Not on file  . Transportation needs:    Medical: Not on file    Non-medical: Not on file  Tobacco Use  . Smoking status: Current Every Day Smoker  . Smokeless tobacco: Never Used  Substance and Sexual Activity  . Alcohol use: No  . Drug use: Not on file  . Sexual activity: Not on file  Lifestyle  . Physical activity:    Days per week: Not on file    Minutes per session: Not on file  . Stress: Not on file  Relationships  . Social connections:    Talks on phone: Not on file    Gets together: Not on file    Attends religious service: Not on file    Active member of club or organization: Not on file    Attends meetings of clubs or organizations: Not on file    Relationship status: Not on file  . Intimate partner violence:    Fear of current or ex partner: Not on file    Emotionally abused: Not on file    Physically abused: Not on file    Forced sexual activity: Not on file  Other Topics Concern  . Not  on file  Social History Narrative  . Not on file    Review of Systems: See HPI, otherwise negative ROS  Physical Exam: BP (!) 152/81 (BP Location: Right Arm)   Pulse (!) 55   Temp 97.9 F (36.6 C) (Oral)   Resp 18   Ht 5' 7"  (1.702 m)   Wt 164 lb 14.4 oz (74.8 kg)   SpO2 100%   BMI 25.83 kg/m  General:   Alert,  pleasant and cooperative in NAD Head:  Normocephalic and atraumatic. Neck:  Supple; no masses or thyromegaly. Lungs:  Clear throughout to auscultation, normal respiratory effort.    Heart:  +S1, +S2, Regular rate and rhythm, No edema. Abdomen:  Soft, nontender and nondistended. Normal bowel sounds, without guarding, and without rebound.   Neurologic:  Alert and  oriented x4;  grossly normal neurologically.  Impression/Plan: Katelyn Garrett is here for an colonoscopy to be performed for ileitis  Risks, benefits, limitations, and alternatives regarding  colonoscopy have been reviewed with the patient.  Questions have been answered.  All parties agreeable.   Jonathon Bellows, MD  08/28/2017, 10:47 AM

## 2017-08-29 LAB — SURGICAL PATHOLOGY

## 2017-09-11 ENCOUNTER — Telehealth: Payer: Self-pay

## 2017-09-11 NOTE — Telephone Encounter (Signed)
Advised patient of results per Dr. Vicente Males.    - Inform patient colon biopsies were normal. Terminal ileum bx showed ileitis- may be due to infection. Follow up with Dr Marius Ditch who saw the patient as in patient and there was concern for Crohns disease

## 2017-09-18 ENCOUNTER — Encounter: Payer: Self-pay | Admitting: Gastroenterology

## 2017-09-18 ENCOUNTER — Other Ambulatory Visit: Payer: Self-pay

## 2017-09-18 ENCOUNTER — Ambulatory Visit (INDEPENDENT_AMBULATORY_CARE_PROVIDER_SITE_OTHER): Payer: Managed Care, Other (non HMO) | Admitting: Gastroenterology

## 2017-09-18 ENCOUNTER — Other Ambulatory Visit
Admission: RE | Admit: 2017-09-18 | Discharge: 2017-09-18 | Disposition: A | Discharge status: Home / Self Care | Payer: Managed Care, Other (non HMO) | Source: Ambulatory Visit | Attending: Gastroenterology | Admitting: Gastroenterology

## 2017-09-18 VITALS — BP 133/69 | HR 67 | Resp 16 | Wt 161.8 lb

## 2017-09-18 DIAGNOSIS — K50012 Crohn's disease of small intestine with intestinal obstruction: Secondary | ICD-10-CM | POA: Diagnosis not present

## 2017-09-18 DIAGNOSIS — Z01419 Encounter for gynecological examination (general) (routine) without abnormal findings: Secondary | ICD-10-CM

## 2017-09-18 LAB — C-REACTIVE PROTEIN: CRP: 2.3 mg/dL — AB (ref <1.0)

## 2017-09-18 LAB — CBC
HCT: 37.9 % (ref 35.0–47.0)
HEMOGLOBIN: 11.9 g/dL — AB (ref 12.0–16.0)
MCH: 20.4 pg — AB (ref 26.0–34.0)
MCHC: 31.5 g/dL — ABNORMAL LOW (ref 32.0–36.0)
MCV: 64.8 fL — ABNORMAL LOW (ref 80.0–100.0)
PLATELETS: 219 10*3/uL (ref 150–440)
RBC: 5.85 MIL/uL — AB (ref 3.80–5.20)
RDW: 17.1 % — ABNORMAL HIGH (ref 11.5–14.5)
WBC: 8.9 10*3/uL (ref 3.6–11.0)

## 2017-09-18 NOTE — Patient Instructions (Addendum)
1.Labs today 2.Gyn referral for well woman's exam 3.Miralax 1-2 times daily 4.Will start paperwork for humira  5. F/u in 2weeks 6. Start fusion plus, oral iron 1 pill daily 7. Start Vit B12 107mg by mouth daily  Please call our office to speak with my nurse TBarnie [phone] business hours from 8am to 4pm if you have any questions/concerns. During after hours, you will be redirected to on call GI physician. For any emergency please call 911 or go the nearest emergency room.    RCephas Darby MD [address] [place] [id number] Main: [phone] Fax: [phone]

## 2017-09-18 NOTE — Progress Notes (Signed)
Cephas Darby, MD [address]  [place]  [place],  [address]  Main: [phone]  Fax: [phone]    Gastroenterology Consultation  Referring Provider:     No ref. provider found Primary Care Physician:  Patient, No Pcp Per Primary Gastroenterologist:  Dr. Cephas Darby Reason for Consultation:     Small bowel Crohn's        HPI:   Katelyn Garrett is a 52 y.o. African-American female seen in consultation & management of small bowel Crohn's. Patient history of chronic tobacco use, was admitted to [facility] on 08/26/2017 with partial small bowel obstruction and imaging revealed thickening of the terminal ileum. She underwent colonoscopy after resolution of partial SPO. It revealed severe stenosis of the terminal ileum concerning for Crohn's disease and pathology came back as mild active ileitis. She was started on Solu-Medrol and discharged home on prednisone taper. She is here to establish care for Crohn's disease.  Patient reports mild right lower quadrant pain for which she took suppository because she had urge to have a BM that she thinks has resulted in abdominal pain. Otherwise, she has been doing well since hospital discharge. She reports having one to 2 soft bowel movements daily, taking MiraLAX and Dulcolax suppository. She denies fever, chills, rectal bleeding, abdominal pain, nausea, vomiting, abdominal distention. She continues to smoke cigarettes, 1 pack lasts for 1 week She is currently on prednisone 20 mg daily  NSAIDs: none  Antiplts/Anticoagulants/Anti thrombotics: none  GI Procedures: colonoscopy by Dr. Vicente Males on 08/28/2017 The perianal and digital rectal examinations were normal. Multiple small-mouthed diverticula were found in the sigmoid colon. A severe stenosis measuring 8 mm (inner diameter) was found at the ileocecal valve and was non-traversed. Biopsies were taken with a cold forceps for histology. Acute inflammation seen at the IC  valve Discontinuous areas of nonbleeding ulcerated mucosa with no stigmata of recent bleeding were present in the cecum. Biopsies were taken with a cold forceps for histology. The ulcers were tiny and apthous in appearance , most pronounced in the cecum. Discontinuous areas of nonbleeding ulcerated mucosa with no stigmata of recent bleeding were present in the ascending colon. Biopsies were taken with a cold forceps for histology. The ulceration was very minimal The exam was otherwise without abnormality on direct and retroflexion views. DIAGNOSIS:  A. TERMINAL ILEUM; COLD BIOPSY:  - FOCAL MILD ILEITIS.  - NEGATIVE FOR DYSPLASIA AND MALIGNANCY.   B. CECUM; COLD BIOPSY:  - COLONIC MUCOSA NEGATIVE FOR MICROSCOPIC COLITIS, DYSPLASIA AND  MALIGNANCY.   C. RANDOM COLON; COLD BIOPSY:  - COLONIC MUCOSA NEGATIVE FOR MICROSCOPIC COLITIS, DYSPLASIA AND  MALIGNANCY.   D. TRANSVERSE COLON; COLD BIOPSY:  - COLONIC MUCOSA NEGATIVE FOR MICROSCOPIC COLITIS, DYSPLASIA AND  MALIGNANCY.   E. DESCENDING COLON; COLD BIOPSY:  - COLONIC MUCOSA NEGATIVE FOR MICROSCOPIC COLITIS, DYSPLASIA AND  MALIGNANCY.   F. RECTUM;COLD BIOPSY:  - COLONIC MUCOSA NEGATIVE FOR MICROSCOPIC COLITIS, DYSPLASIA AND  MALIGNANCY.   Past Medical History:  Diagnosis Date  . Chronic constipation     Past Surgical History:  Procedure Laterality Date  . COLONOSCOPY N/A 08/28/2017   Procedure: COLONOSCOPY;  Surgeon: Jonathon Bellows, MD;  Location: [facility] ENDOSCOPY;  Service: Gastroenterology;  Laterality: N/A;  . TUBAL LIGATION      Current Outpatient Medications:  .  HYDROcodone-acetaminophen (NORCO/VICODIN) 5-325 MG tablet, Take 1-2 tablets by mouth every 4 (four) hours as needed for moderate pain., Disp: 30 tablet, Rfl: 0 .  polyethylene glycol (MIRALAX)  packet, Take 17 g by mouth daily., Disp: 14 each, Rfl: 0 .  predniSONE (DELTASONE) 10 MG tablet, Take 4 tablets (40 mg total) by mouth daily. As directed see prescription,  Disp: 30 tablet, Rfl: 0    No family history on file.   Social History   Tobacco Use  . Smoking status: Current Every Day Smoker  . Smokeless tobacco: Never Used  Substance Use Topics  . Alcohol use: No  . Drug use: Not on file    Allergies as of 09/18/2017  . (No Known Allergies)    Review of Systems:    All systems reviewed and negative except where noted in HPI.   Physical Exam:  BP 133/69 (BP Location: Left Arm, Patient Position: Sitting, Cuff Size: Normal)   Pulse 67   Resp 16   Wt 161 lb 12.8 oz (73.4 kg)   BMI 25.34 kg/m  No LMP recorded. (Menstrual status: Perimenopausal).  General:   Alert,  Well-developed, well-nourished, pleasant and cooperative in NAD Head:  Normocephalic and atraumatic. Eyes:  Sclera clear, no icterus.   Conjunctiva pink. Ears:  Normal auditory acuity. Nose:  No deformity, discharge, or lesions. Mouth:  No deformity or lesions,oropharynx pink & moist. Neck:  Supple; no masses or thyromegaly. Lungs:  Respirations even and unlabored.  Clear throughout to auscultation.   No wheezes, crackles, or rhonchi. No acute distress. Heart:  Regular rate and rhythm; no murmurs, clicks, rubs, or gallops. Abdomen:  Normal bowel sounds. Soft, non-tender and non-distended without masses, hepatosplenomegaly or hernias noted.  No guarding or rebound tenderness.   Rectal: Not performed Msk:  Symmetrical without gross deformities. Good, equal movement & strength bilaterally. Pulses:  Normal pulses noted. Extremities:  No clubbing or edema.  No cyanosis. Neurologic:  Alert and oriented x3;  grossly normal neurologically. Skin:  Intact without significant lesions or rashes. No jaundice. Lymph Nodes:  No significant cervical adenopathy. Psych:  Alert and cooperative. Normal mood and affect.  Imaging Studies: Abdominal imaging reviewed  Assessment and Plan:   ELLAN TESS is a 52 y.o. 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 Discussed in length with her about long-term management of Crohn's disease with maintenance therapy to bring disease to clinical and endoscopic remission and avoid flareups needing steroid use. I recommend immunosuppressive therapy with anti-TNF plus immunomodulator after completion of workup She also has mild B12 and iron deficiency  - Check CBC, hepatitis B and A serologies - TB quantiferon gold - CRP - Start Vit B12 1055mg by mouth daily, Start fusion plus, oral iron 1 pill daily - Urgent referral to Gyn for well woman's exam prior to any sedation off immunosuppressive therapy - continue prednisone 20 mg daily for now - Continue MiraLAX one to 2 times daily, to avoid constipation - Avoid insoluble fiber, red meat - Highly advised to quit smoking   Follow up in 2 weeks   RCephas Darby MD

## 2017-09-19 LAB — HEPATITIS A ANTIBODY, TOTAL: Hep A Total Ab: NEGATIVE

## 2017-09-19 LAB — HEPATITIS B CORE ANTIBODY, TOTAL: Hep B Core Total Ab: POSITIVE — AB

## 2017-09-19 LAB — HEPATITIS C ANTIBODY: HCV Ab: <0.1 s/co ratio (ref 0.0–0.9)

## 2017-09-19 LAB — HEPATITIS B SURFACE ANTIBODY, QUANTITATIVE: Hepatitis B-Post: <3.1 m[IU]/mL — ABNORMAL LOW (ref >9.9)

## 2017-09-19 LAB — HEPATITIS B SURFACE ANTIGEN: HEP B S AG: NEGATIVE

## 2017-09-20 ENCOUNTER — Telehealth: Payer: Self-pay | Admitting: Gastroenterology

## 2017-09-20 NOTE — Telephone Encounter (Signed)
Katelyn Garrett wit  [employer] pharamcy [place] left vm in regards to pt to see if ok to perscripted [place]   Dispensing pen starter kit wanted see pt being on all Pens no starter kit was ok to start   Call [phone] ext [phone]

## 2017-09-22 LAB — QUANTIFERON-TB GOLD PLUS (RQFGPL)
QUANTIFERON NIL VALUE: 0.16 [IU]/mL
QuantiFERON TB1 Ag Value: 0.22 IU/mL
QuantiFERON TB2 Ag Value: 0.21 IU/mL

## 2017-09-22 LAB — QUANTIFERON-TB GOLD PLUS: QuantiFERON-TB Gold Plus: NEGATIVE

## 2017-10-04 ENCOUNTER — Ambulatory Visit: Payer: Managed Care, Other (non HMO) | Admitting: Gastroenterology

## 2017-10-12 ENCOUNTER — Encounter: Payer: Self-pay | Admitting: Obstetrics and Gynecology

## 2017-10-23 ENCOUNTER — Ambulatory Visit: Payer: Managed Care, Other (non HMO) | Admitting: Gastroenterology

## 2017-12-04 ENCOUNTER — Ambulatory Visit: Payer: Managed Care, Other (non HMO) | Admitting: Gastroenterology

## 2017-12-04 ENCOUNTER — Encounter: Payer: Self-pay | Admitting: Gastroenterology

## 2018-08-13 ENCOUNTER — Emergency Department: Payer: Self-pay

## 2018-08-13 ENCOUNTER — Encounter: Payer: Self-pay | Admitting: Emergency Medicine

## 2018-08-13 ENCOUNTER — Emergency Department
Admission: EM | Admit: 2018-08-13 | Discharge: 2018-08-13 | Disposition: A | Discharge status: Home / Self Care | Payer: Self-pay | Attending: Emergency Medicine | Admitting: Emergency Medicine

## 2018-08-13 ENCOUNTER — Other Ambulatory Visit: Payer: Self-pay

## 2018-08-13 DIAGNOSIS — R42 Dizziness and giddiness: Secondary | ICD-10-CM | POA: Insufficient documentation

## 2018-08-13 DIAGNOSIS — K047 Periapical abscess without sinus: Secondary | ICD-10-CM | POA: Insufficient documentation

## 2018-08-13 DIAGNOSIS — F1210 Cannabis abuse, uncomplicated: Secondary | ICD-10-CM | POA: Insufficient documentation

## 2018-08-13 DIAGNOSIS — R220 Localized swelling, mass and lump, head: Secondary | ICD-10-CM

## 2018-08-13 DIAGNOSIS — F17200 Nicotine dependence, unspecified, uncomplicated: Secondary | ICD-10-CM | POA: Insufficient documentation

## 2018-08-13 HISTORY — DX: Crohn's disease of large intestine without complications: K50.10

## 2018-08-13 LAB — COMPREHENSIVE METABOLIC PANEL
ALT: 18 U/L (ref 0–44)
AST: 20 U/L (ref 15–41)
Albumin: 3.9 g/dL (ref 3.5–5.0)
Alkaline Phosphatase: 139 U/L — ABNORMAL HIGH (ref 38–126)
Anion gap: 8 (ref 5–15)
BUN: 12 mg/dL (ref 6–20)
CO2: 25 mmol/L (ref 22–32)
Calcium: 8.6 mg/dL — ABNORMAL LOW (ref 8.9–10.3)
Chloride: 109 mmol/L (ref 98–111)
Creatinine, Ser: 0.62 mg/dL (ref 0.44–1.00)
GFR calc Af Amer: >60 mL/min (ref >60)
GFR calc non Af Amer: >60 mL/min (ref >60)
Glucose, Bld: 124 mg/dL — ABNORMAL HIGH (ref 70–99)
Potassium: 3.3 mmol/L — ABNORMAL LOW (ref 3.5–5.1)
Sodium: 142 mmol/L (ref 135–145)
Total Bilirubin: 0.4 mg/dL (ref 0.3–1.2)
Total Protein: 7.1 g/dL (ref 6.5–8.1)

## 2018-08-13 LAB — CBC WITH DIFFERENTIAL/PLATELET
Abs Immature Granulocytes: 0.03 10*3/uL (ref 0.00–0.07)
Basophils Absolute: 0 10*3/uL (ref 0.0–0.1)
Basophils Relative: 0 %
Eosinophils Absolute: 0.1 10*3/uL (ref 0.0–0.5)
Eosinophils Relative: 2 %
HCT: 36.7 % (ref 36.0–46.0)
Hemoglobin: 11.5 g/dL — ABNORMAL LOW (ref 12.0–15.0)
Immature Granulocytes: 0 %
Lymphocytes Relative: 32 %
Lymphs Abs: 2.5 10*3/uL (ref 0.7–4.0)
MCH: 20.8 pg — ABNORMAL LOW (ref 26.0–34.0)
MCHC: 31.3 g/dL (ref 30.0–36.0)
MCV: 66.5 fL — ABNORMAL LOW (ref 80.0–100.0)
Monocytes Absolute: 0.4 10*3/uL (ref 0.1–1.0)
Monocytes Relative: 5 %
Neutro Abs: 4.9 10*3/uL (ref 1.7–7.7)
Neutrophils Relative %: 61 %
Platelets: 236 10*3/uL (ref 150–400)
RBC: 5.52 MIL/uL — ABNORMAL HIGH (ref 3.87–5.11)
RDW: 15.9 % — ABNORMAL HIGH (ref 11.5–15.5)
WBC: 7.9 10*3/uL (ref 4.0–10.5)
nRBC: 0 % (ref 0.0–0.2)

## 2018-08-13 LAB — TROPONIN I: Troponin I: <0.03 ng/mL (ref <0.03)

## 2018-08-13 MED ORDER — SODIUM CHLORIDE 0.9 % IV SOLN
Freq: Once | INTRAVENOUS | Status: AC
  Administered 2018-08-13: 14:00:00 via INTRAVENOUS

## 2018-08-13 MED ORDER — PENICILLIN V POTASSIUM 500 MG PO TABS: 500.0000 mg | ORAL_TABLET | Freq: Four times a day (QID) | ORAL | 0 refills | Status: DC

## 2018-08-13 MED ORDER — PREDNISONE 10 MG (21) PO TBPK: ORAL_TABLET | ORAL | 0 refills | Status: DC

## 2018-08-13 NOTE — ED Triage Notes (Signed)
Pt presents to ED with c/o R sided facial droop. Pt states went to bed at approx 0100 this morning normal and woke up with dizziness and facial droop. Pt denies numbness/tingling. Pt with mild R sided grip weakness on assessment. Denies vision changes, able to raise eyebrows when asked.

## 2018-08-13 NOTE — ED Provider Notes (Signed)
[facility] Emergency Department Provider Note       Time seen: ----------------------------------------- 1:48 PM on 08/13/2018 -----------------------------------------   I have reviewed the triage vital signs and the nursing notes.  HISTORY   Chief Complaint Facial Droop    HPI Katelyn Garrett is a 53 y.o. female with a history of chronic constipation, chronic colitis, small bowel obstruction who presents to the ED for sided facial swelling patient reports she went to bed normally at 1 AM last night and woke up with dizziness and facial swelling.  She denies any numbness or tingling.  She was also noted to have some right hand weakness when she got here.  Denies any vision changes.  She has never had these symptoms before.  Past Medical History:  Diagnosis Date  . Chronic constipation   . Crohn's colitis [facility])     Patient Active Problem List   Diagnosis Date Noted  . Partial small bowel obstruction ([place])   . Terminal ileitis, with intestinal obstruction ([place])   . Abdominal pain 08/25/2017  . Right lower quadrant abdominal pain   . Intussusception intestine ([place]) 10/02/2016    Past Surgical History:  Procedure Laterality Date  . COLONOSCOPY N/A 08/28/2017   Procedure: COLONOSCOPY;  Surgeon: Jonathon Bellows, MD;  Location: [facility] ENDOSCOPY;  Service: Gastroenterology;  Laterality: N/A;  . TUBAL LIGATION      Allergies Patient has no known allergies.  Social History Social History   Tobacco Use  . Smoking status: Current Every Day Smoker  . Smokeless tobacco: Never Used  Substance Use Topics  . Alcohol use: No  . Drug use: Yes    Types: Marijuana    Comment: Occ   Review of Systems Constitutional: Negative for fever. HEENT: Positive for right-sided facial swelling Cardiovascular: Negative for chest pain. Respiratory: Negative for shortness of breath. Gastrointestinal: Negative for abdominal pain, vomiting and diarrhea. Musculoskeletal:  Negative for back pain. Skin: Negative for rash. Neurological: Positive for facial drooping, dizziness  All systems negative/normal/unremarkable except as stated in the HPI  ____________________________________________   PHYSICAL EXAM:  VITAL SIGNS: ED Triage Vitals  Enc Vitals Group     BP 08/13/18 1337 (!) 163/79     Pulse Rate 08/13/18 1337 (!) 57     Resp 08/13/18 1337 20     Temp 08/13/18 1337 98.2 F (36.8 C)     Temp Source 08/13/18 1337 Oral     SpO2 08/13/18 1337 97 %     Weight 08/13/18 1338 168 lb (76.2 kg)     Height 08/13/18 1338 5' 7"  (1.702 m)     Head Circumference --      Peak Flow --      Pain Score 08/13/18 1338 6     Pain Loc --      Pain Edu? --      Excl. in [place]? --    Constitutional: Alert and oriented. Well appearing and in no distress. Eyes: Conjunctivae are normal. Normal extraocular movements. ENT      Head: Normocephalic and atraumatic.      Nose: No congestion/rhinnorhea.      Mouth/Throat: Mucous membranes are moist.  Right-sided perimandibular swelling is noted, no obvious dental abscess, dental caries are noted.      Neck: No stridor. Cardiovascular: Normal rate, regular rhythm. No murmurs, rubs, or gallops. Respiratory: Normal respiratory effort without tachypnea nor retractions. Breath sounds are clear and equal bilaterally. No wheezes/rales/rhonchi. Gastrointestinal: Soft and nontender. Normal bowel sounds Musculoskeletal:  Nontender with normal range of motion in extremities. No lower extremity tenderness nor edema. Neurologic:  Normal speech and language. No gross focal neurologic deficits are appreciated.  Strength, sensation, cranial nerves appear to be normal. Skin:  Skin is warm, dry and intact. No rash noted. Psychiatric: Mood and affect are normal. Speech and behavior are normal.  ____________________________________________  EKG: Interpreted by me.  Sinus bradycardia with a rate of 58 bpm, normal PR interval, normal QRS, normal  QT  ____________________________________________  ED COURSE:  As part of my medical decision making, I reviewed the following data within the [place] History obtained from family if available, nursing notes, old chart and ekg, as well as notes from prior ED visits. Patient presented for facial droop and dizziness, we will assess with labs and imaging as indicated at this time.   Procedures Katelyn Garrett was evaluated in Emergency Department on 08/13/2018 for the symptoms described in the history of present illness. She was evaluated in the context of the global COVID-19 pandemic, which necessitated consideration that the patient might be at risk for infection with the SARS-CoV-2 virus that causes COVID-19. Institutional protocols and algorithms that pertain to the evaluation of patients at risk for COVID-19 are in a state of rapid change based on information released by regulatory bodies including the CDC and federal and state organizations. These policies and algorithms were followed during the patient's care in the ED.  ____________________________________________   LABS (pertinent positives/negatives)  Labs Reviewed  COMPREHENSIVE METABOLIC PANEL - Abnormal; Notable for the following components:      Result Value   Potassium 3.3 (*)    Glucose, Bld 124 (*)    Calcium 8.6 (*)    Alkaline Phosphatase 139 (*)    All other components within normal limits  CBC WITH DIFFERENTIAL/PLATELET  TROPONIN I  CBG MONITORING, ED    RADIOLOGY Images were viewed by me  CT head IMPRESSION: HEAD CT  1. Normal.  MAXILLOFACIAL CT  1. No fracture.  No bone lesion. 2. Dental disease with right mandibular region soft tissue swelling/edema. ____________________________________________   DIFFERENTIAL DIAGNOSIS   CVA, TIA, Bell's palsy, dehydration, dental caries, electrolyte abnormality  FINAL ASSESSMENT AND PLAN  Dental infection   Plan: The patient had presented  for facial droop and dizziness. Patient's labs did not reveal any acute process. Patient's imaging was reassuring, there is dental disease in the right mandibular region.  She will be placed on antibiotics and steroids.  She is cleared for outpatient dentistry follow-up as possible.   Laurence Aly, MD    Note: This note was generated in part or whole with voice recognition software. Voice recognition is usually quite accurate but there are transcription errors that can and very often do occur. I apologize for any typographical errors that were not detected and corrected.     Earleen Newport, MD 08/13/18 [phone]

## 2018-08-13 NOTE — ED Notes (Signed)
PT was provided food by Dr. Jimmye Norman

## 2018-08-13 NOTE — ED Notes (Signed)
Discharge signature on paper in records

## 2018-09-06 ENCOUNTER — Encounter: Payer: Self-pay | Admitting: Emergency Medicine

## 2018-09-06 ENCOUNTER — Other Ambulatory Visit: Payer: Self-pay

## 2018-09-06 DIAGNOSIS — Z79899 Other long term (current) drug therapy: Secondary | ICD-10-CM

## 2018-09-06 DIAGNOSIS — F1290 Cannabis use, unspecified, uncomplicated: Secondary | ICD-10-CM | POA: Diagnosis present

## 2018-09-06 DIAGNOSIS — Z7951 Long term (current) use of inhaled steroids: Secondary | ICD-10-CM

## 2018-09-06 DIAGNOSIS — K50913 Crohn's disease, unspecified, with fistula: Principal | ICD-10-CM | POA: Diagnosis present

## 2018-09-06 DIAGNOSIS — F17210 Nicotine dependence, cigarettes, uncomplicated: Secondary | ICD-10-CM | POA: Diagnosis present

## 2018-09-06 NOTE — ED Triage Notes (Signed)
Patient ambulatory to triage with steady gait, without difficulty or distress noted, mask in place; pt reports hx Chron's; no BM in several days, having rt lower abd pain accomp by nausea

## 2018-09-07 ENCOUNTER — Inpatient Hospital Stay
Admission: EM | Admit: 2018-09-07 | Discharge: 2018-09-07 | DRG: 387 | Disposition: A | Discharge status: Other Acute Inpatient Hospital | Payer: Self-pay | Attending: Internal Medicine | Admitting: Internal Medicine

## 2018-09-07 ENCOUNTER — Emergency Department: Payer: Self-pay

## 2018-09-07 ENCOUNTER — Encounter: Payer: Self-pay | Admitting: Radiology

## 2018-09-07 DIAGNOSIS — K50113 Crohn's disease of large intestine with fistula: Secondary | ICD-10-CM | POA: Diagnosis present

## 2018-09-07 DIAGNOSIS — K50913 Crohn's disease, unspecified, with fistula: Principal | ICD-10-CM

## 2018-09-07 LAB — COMPREHENSIVE METABOLIC PANEL
ALT: 14 U/L (ref 0–44)
AST: 19 U/L (ref 15–41)
Albumin: 4 g/dL (ref 3.5–5.0)
Alkaline Phosphatase: 108 U/L (ref 38–126)
Anion gap: 10 (ref 5–15)
BUN: 11 mg/dL (ref 6–20)
CO2: 24 mmol/L (ref 22–32)
Calcium: 9.1 mg/dL (ref 8.9–10.3)
Chloride: 107 mmol/L (ref 98–111)
Creatinine, Ser: 0.59 mg/dL (ref 0.44–1.00)
GFR calc Af Amer: >60 mL/min (ref >60)
GFR calc non Af Amer: >60 mL/min (ref >60)
Glucose, Bld: 125 mg/dL — ABNORMAL HIGH (ref 70–99)
Potassium: 3.8 mmol/L (ref 3.5–5.1)
Sodium: 141 mmol/L (ref 135–145)
Total Bilirubin: 0.7 mg/dL (ref 0.3–1.2)
Total Protein: 7.8 g/dL (ref 6.5–8.1)

## 2018-09-07 LAB — CBC WITH DIFFERENTIAL/PLATELET
Abs Immature Granulocytes: 0.03 10*3/uL (ref 0.00–0.07)
Basophils Absolute: 0 10*3/uL (ref 0.0–0.1)
Basophils Relative: 0 %
Eosinophils Absolute: 0.2 10*3/uL (ref 0.0–0.5)
Eosinophils Relative: 2 %
HCT: 37.5 % (ref 36.0–46.0)
Hemoglobin: 11.9 g/dL — ABNORMAL LOW (ref 12.0–15.0)
Immature Granulocytes: 0 %
Lymphocytes Relative: 26 %
Lymphs Abs: 2.6 10*3/uL (ref 0.7–4.0)
MCH: 20.6 pg — ABNORMAL LOW (ref 26.0–34.0)
MCHC: 31.7 g/dL (ref 30.0–36.0)
MCV: 64.9 fL — ABNORMAL LOW (ref 80.0–100.0)
Monocytes Absolute: 0.8 10*3/uL (ref 0.1–1.0)
Monocytes Relative: 8 %
Neutro Abs: 6.3 10*3/uL (ref 1.7–7.7)
Neutrophils Relative %: 64 %
Platelets: 269 10*3/uL (ref 150–400)
RBC: 5.78 MIL/uL — ABNORMAL HIGH (ref 3.87–5.11)
RDW: 15.8 % — ABNORMAL HIGH (ref 11.5–15.5)
WBC: 9.8 10*3/uL (ref 4.0–10.5)
nRBC: 0 % (ref 0.0–0.2)

## 2018-09-07 LAB — HEMOGLOBIN A1C
Hgb A1c MFr Bld: 6.5 % — ABNORMAL HIGH (ref 4.8–5.6)
Mean Plasma Glucose: 139.85 mg/dL

## 2018-09-07 LAB — LIPASE, BLOOD: Lipase: 23 U/L (ref 11–51)

## 2018-09-07 LAB — TSH: TSH: 3.133 u[IU]/mL (ref 0.350–4.500)

## 2018-09-07 MED ORDER — ACETAMINOPHEN 650 MG RE SUPP: 650.0000 mg | Freq: Four times a day (QID) | RECTAL | Status: DC | PRN

## 2018-09-07 MED ORDER — ONDANSETRON HCL 4 MG/2ML IJ SOLN
4.0000 mg | Freq: Once | INTRAMUSCULAR | Status: AC
  Administered 2018-09-07: 4 mg via INTRAVENOUS
  Filled 2018-09-07: qty 2

## 2018-09-07 MED ORDER — ADULT MULTIVITAMIN W/MINERALS CH: 1.0000 | ORAL_TABLET | Freq: Every day | ORAL | Status: DC

## 2018-09-07 MED ORDER — ONDANSETRON HCL 4 MG/2ML IJ SOLN: 4.0000 mg | Freq: Four times a day (QID) | INTRAMUSCULAR | Status: DC | PRN

## 2018-09-07 MED ORDER — MORPHINE SULFATE (PF) 4 MG/ML IV SOLN
4.0000 mg | Freq: Once | INTRAVENOUS | Status: AC
  Administered 2018-09-07: 4 mg via INTRAVENOUS
  Filled 2018-09-07: qty 1

## 2018-09-07 MED ORDER — ACETAMINOPHEN 325 MG PO TABS: 650.0000 mg | ORAL_TABLET | Freq: Four times a day (QID) | ORAL | Status: DC | PRN

## 2018-09-07 MED ORDER — ENSURE ENLIVE PO LIQD
237.0000 mL | Freq: Two times a day (BID) | ORAL | Status: DC
  Administered 2018-09-07: 237 mL via ORAL

## 2018-09-07 MED ORDER — METHYLPREDNISOLONE SODIUM SUCC 125 MG IJ SOLR: 60.0000 mg | INTRAMUSCULAR | Status: DC

## 2018-09-07 MED ORDER — METRONIDAZOLE IN NACL 5-0.79 MG/ML-% IV SOLN
500.0000 mg | Freq: Once | INTRAVENOUS | Status: AC
  Administered 2018-09-07: 500 mg via INTRAVENOUS
  Filled 2018-09-07: qty 100

## 2018-09-07 MED ORDER — METRONIDAZOLE IN NACL 5-0.79 MG/ML-% IV SOLN
500.0000 mg | Freq: Three times a day (TID) | INTRAVENOUS | Status: DC
  Administered 2018-09-07: 12:00:00 500 mg via INTRAVENOUS
  Filled 2018-09-07 (×4): qty 100

## 2018-09-07 MED ORDER — SODIUM CHLORIDE 0.9 % IV SOLN
INTRAVENOUS | Status: DC
  Administered 2018-09-07 (×2): via INTRAVENOUS

## 2018-09-07 MED ORDER — METHYLPREDNISOLONE SODIUM SUCC 125 MG IJ SOLR
125.0000 mg | Freq: Once | INTRAMUSCULAR | Status: AC
  Administered 2018-09-07: 02:00:00 125 mg via INTRAVENOUS
  Filled 2018-09-07: qty 2

## 2018-09-07 MED ORDER — ONDANSETRON HCL 4 MG PO TABS: 4.0000 mg | ORAL_TABLET | Freq: Four times a day (QID) | ORAL | Status: DC | PRN

## 2018-09-07 MED ORDER — IOHEXOL 300 MG/ML  SOLN
100.0000 mL | Freq: Once | INTRAMUSCULAR | Status: AC | PRN
  Administered 2018-09-07: 100 mL via INTRAVENOUS

## 2018-09-07 MED ORDER — PIPERACILLIN-TAZOBACTAM 3.375 G IVPB
3.3750 g | Freq: Three times a day (TID) | INTRAVENOUS | Status: DC
  Administered 2018-09-07: 3.375 g via INTRAVENOUS
  Filled 2018-09-07: qty 50

## 2018-09-07 NOTE — Progress Notes (Signed)
Initial Nutrition Assessment  DOCUMENTATION CODES:   Not applicable  INTERVENTION:   Ensure Enlive po BID, each supplement provides 350 kcal and 20 grams of protein  MVI daily   NUTRITION DIAGNOSIS:   Inadequate oral intake related to acute illness as evidenced by per patient/family report.  GOAL:   Patient will meet greater than or equal to 90% of their needs  MONITOR:   PO intake, Supplement acceptance, Labs, Weight trends, I & O's, Skin  REASON FOR ASSESSMENT:   Malnutrition Screening Tool    ASSESSMENT:   53 y/o female with past medical history of Crohn's disease presents to the emergency department complaining of abdominal pain.  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   RD working remotely.  Per chart review, pt with poor appetite and oral intake for 5 days pta r/t nausea, vomiting and abdominal pain. Pt continues to have poor oral intake in hospital. RD will add supplements and MVI to help pt meet her estimated needs. Per chart, pt appears fairly weight stable pta.   Medications reviewed and include: solu-medrol, NaCl @125ml /hr, metronidazole   Labs reviewed:   Unable to complete Nutrition-Focused physical exam at this time.   Diet Order:   Diet Order            Diet full liquid Room service appropriate? Yes; Fluid consistency: Thin  Diet effective now             EDUCATION NEEDS:   No education needs have been identified at this time  Skin:  Skin Assessment: Reviewed RN Assessment  Last BM:  4/27  Height:   Ht Readings from Last 1 Encounters:  09/07/18 5' 7.5" (1.715 m)    Weight:   Wt Readings from Last 1 Encounters:  09/07/18 72.4 kg    Ideal Body Weight:  62.5 kg  BMI:  Body mass index is 24.64 kg/m.  Estimated Nutritional Needs:   Kcal:  1700-2000kcal/day   Protein:  85-100g/day   Fluid:  1.9L/day   Koleen Distance MS, RD,  LDN Pager #- [phone] Office#- [phone] After Hours Pager: [phone]

## 2018-09-07 NOTE — Plan of Care (Signed)

## 2018-09-07 NOTE — Consult Note (Signed)
Katelyn Darby, MD [address]  [place]  [place], [place] [address]  Main: [phone]  Fax: [phone] Pager: [phone]   Consultation  Referring Provider:     No ref. provider found Primary Care Physician:  Patient, No Pcp Per Primary Gastroenterologist:  Dr. Sherri Sear         Reason for Consultation:     Crohn's disease, fistula  Date of Admission:  09/07/2018 Date of Consultation:  09/07/2018         HPI:   Katelyn Garrett is a 53 y.o. 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  I initially saw patient in the hospital in 08/2017, subsequently in my office in 09/2017 only once.  Since then, she was lost to follow-up as she did not have insurance.  In last 1 year, she reports that she has been on intermittent prednisone during flareups.  However, over the last 2 weeks she has been experiencing progressively worse right lower quadrant pain, associated with nausea and vomiting.  Patient denies fever, chills.  She has been hemodynamically stable. She had tooth ache last month and came to [facility] ER on 08/13/2018.  She said her symptoms slowly started at that time.  She has been taking Aleve 2 pills daily for the last 2 weeks.  She is also smoking tobacco.  This past Sunday, she could not have a bowel movement.  She reports that she had a bowel movement today.  She presented to ER early this morning, did not have leukocytosis, has mild anemia, normal lipase, underwent CT abdomen and pelvis with contrast which revealed multiple thickened, hyperenhancing and fluid dilated small bowel loops in the deep pelvis measuring up to 4 cm, terminal ileal stricture, and new enhancing and irregular inflammatory fistula arising from the tip of the cecum and or appendix tracking into the deep pelvis measuring up to 3 to 4 cm and  possible early fistulization also to the sigmoid colon Patient is started on Solu-Medrol, Flagyl, full liquids, patient is also seen by general surgery who recommended no emergent surgery, but recommended to be seen by colorectal surgery  NSAIDs: Aleve twice daily for the last 2 weeks  Antiplts/Anticoagulants/Anti thrombotics: None  GI Procedures:  colonoscopy by Dr. Vicente Males on 08/28/2017 The perianal and digital rectal examinations were normal. Multiple small-mouthed diverticula were found in the sigmoid colon. A severe stenosis measuring 8 mm (inner diameter) was found at the ileocecal valve and was non-traversed. Biopsies were taken with a cold forceps for histology. Acute inflammation seen at the IC valve Discontinuous areas of nonbleeding ulcerated mucosa with no stigmata of recent bleeding were present in the cecum. Biopsies were taken with a cold forceps for histology. The ulcers were tiny and apthous in appearance , most pronounced in the cecum. Discontinuous areas of nonbleeding ulcerated mucosa with no stigmata of recent bleeding were present in the ascending colon. Biopsies were taken with a cold forceps for histology. The ulceration was very minimal The exam was otherwise without abnormality on direct and retroflexion views. DIAGNOSIS:  A. TERMINAL ILEUM; COLD BIOPSY:  - FOCAL MILD ILEITIS.  - NEGATIVE FOR DYSPLASIA AND MALIGNANCY.   B. CECUM; COLD BIOPSY:  - COLONIC MUCOSA NEGATIVE FOR MICROSCOPIC COLITIS, DYSPLASIA AND  MALIGNANCY.   C. RANDOM COLON; COLD BIOPSY:  - COLONIC MUCOSA NEGATIVE FOR MICROSCOPIC COLITIS, DYSPLASIA AND  MALIGNANCY.   D. TRANSVERSE COLON; COLD BIOPSY:  - COLONIC MUCOSA NEGATIVE FOR MICROSCOPIC COLITIS, DYSPLASIA AND  MALIGNANCY.   E. DESCENDING COLON; COLD BIOPSY:  - COLONIC MUCOSA NEGATIVE FOR MICROSCOPIC COLITIS, DYSPLASIA AND  MALIGNANCY.   F. RECTUM;COLD BIOPSY:  - COLONIC MUCOSA NEGATIVE FOR MICROSCOPIC COLITIS, DYSPLASIA AND    MALIGNANCY.    Past Medical History:  Diagnosis Date   Chronic constipation    Crohn's colitis [facility])     Past Surgical History:  Procedure Laterality Date   COLONOSCOPY N/A 08/28/2017   Procedure: COLONOSCOPY;  Surgeon: Jonathon Bellows, MD;  Location[facility] ENDOSCOPY;  Service: Gastroenterology;  Laterality: N/A;   TUBAL LIGATION      Prior to Admission medications   Medication Sig Start Date End Date Taking? Authorizing Provider  naproxen sodium (ALEVE) 220 MG tablet Take 220 mg by mouth every 8 (eight) hours as needed.   Yes [provider]  predniSONE (DELTASONE) 10 MG tablet Take 4 tablets (40 mg total) by mouth daily. As directed see prescription 08/28/17  Yes Dustin Flock, MD   Current Facility-Administered Medications:    0.9 %  sodium chloride infusion, , Intravenous, Continuous, Harrie Foreman, MD, Last Rate: 125 mL/hr at 09/07/18 1202   acetaminophen (TYLENOL) tablet 650 mg, 650 mg, Oral, Q6H PRN **OR** acetaminophen (TYLENOL) suppository 650 mg, 650 mg, Rectal, Q6H PRN, Harrie Foreman, MD   [START ON 09/08/2018] multivitamin with minerals tablet 1 tablet, 1 tablet, Oral, Daily, Mody, Sital, MD   ondansetron (ZOFRAN) tablet 4 mg, 4 mg, Oral, Q6H PRN **OR** ondansetron (ZOFRAN) injection 4 mg, 4 mg, Intravenous, Q6H PRN, Harrie Foreman, MD   piperacillin-tazobactam (ZOSYN) IVPB 3.375 g, 3.375 g, Intravenous, Q8H, Mody, Sital, MD   No family history on file.   Social History   Tobacco Use   Smoking status: Current Every Day Smoker   Smokeless tobacco: Never Used  Substance Use Topics   Alcohol use: No   Drug use: Yes    Types: Marijuana    Comment: Occ    Allergies as of 09/06/2018   (No Known Allergies)    Review of Systems:    All systems reviewed and negative except where noted in HPI.   Physical Exam:  Vital signs in last 24 hours: Temp:  [97.3 F (36.3 C)-98.4 F (36.9 C)] 97.5 F (36.4 C) (05/01 1626) Pulse Rate:   [54-93] 54 (05/01 1626) Resp:  [16-20] 16 (05/01 1626) BP: (120-138)/(75-106) 138/75 (05/01 1626) SpO2:  [94 %-99 %] 97 % (05/01 1626) Weight:  [72.4 kg-76.2 kg] 72.4 kg (05/01 0315) Last BM Date: 09/03/18   General:   Pleasant, cooperative in NAD Head:  Normocephalic and atraumatic. Eyes:   No icterus.   Conjunctiva pink. PERRLA. Ears:  Normal auditory acuity. Neck:  Supple; no masses or thyroidomegaly Lungs: Respirations even and unlabored. Lungs clear to auscultation bilaterally.   No wheezes, crackles, or rhonchi.  Heart:  Regular rate and rhythm;  Without murmur, clicks, rubs or gallops Abdomen:  Soft, nondistended, fullness in her right lower quadrant, mildly tender. Normal bowel sounds. No appreciable masses or hepatomegaly.  No rebound or guarding.  Rectal:  Not performed. Msk:  Symmetrical without gross deformities.  Strength normal Extremities:  Without edema, cyanosis or clubbing. Neurologic:  Alert and oriented x3;  grossly normal neurologically. Skin:  Intact without significant lesions or rashes. Psych:  Alert and cooperative. Normal affect.  LAB RESULTS: CBC Latest Ref Rng & Units 09/07/2018 08/13/2018 09/18/2017  WBC 4.0 - 10.5 K/uL 9.8 7.9 8.9  Hemoglobin 12.0 - 15.0 g/dL 11.9(L) 11.5(L) 11.9(L)  Hematocrit 36.0 - 46.0 % 37.5 36.7 37.9  Platelets 150 - 400 K/uL 269 236 219    BMET BMP Latest Ref Rng & Units 09/07/2018 08/13/2018 08/28/2017  Glucose 70 - 99 mg/dL 125(H) 124(H) 127(H)  BUN 6 - 20 mg/dL 11 12 7   Creatinine 0.44 - 1.00 mg/dL 0.59 0.62 0.61  Sodium 135 - 145 mmol/L 141 142 141  Potassium 3.5 - 5.1 mmol/L 3.8 3.3(L) 3.5  Chloride 98 - 111 mmol/L 107 109 106  CO2 22 - 32 mmol/L 24 25 29   Calcium 8.9 - 10.3 mg/dL 9.1 8.6(L) 8.8(L)    LFT Hepatic Function Latest Ref Rng & Units 09/07/2018 08/13/2018 08/25/2017  Total Protein 6.5 - 8.1 g/dL 7.8 7.1 7.5  Albumin 3.5 - 5.0 g/dL 4.0 3.9 3.8  AST 15 - 41 U/L 19 20 18   ALT 0 - 44 U/L 14 18 15   Alk Phosphatase 38 -  126 U/L 108 139(H) 145(H)  Total Bilirubin 0.3 - 1.2 mg/dL 0.7 0.4 0.9     STUDIES: Ct Abdomen Pelvis W Contrast  Result Date: 09/07/2018 CLINICAL DATA:  53 year old female with Crohn disease. Right lower abdominal pain and nausea. EXAM: CT ABDOMEN AND PELVIS WITH CONTRAST TECHNIQUE: Multidetector CT imaging of the abdomen and pelvis was performed using the standard protocol following bolus administration of intravenous contrast. CONTRAST:  171m OMNIPAQUE IOHEXOL 300 MG/ML  SOLN COMPARISON:  CT Abdomen and Pelvis 08/25/2017 and earlier. FINDINGS: Lower chest: Similar low lung volumes and minor lung base atelectasis. No cardiomegaly, pericardial or pleural effusion. Hepatobiliary: Negative liver and gallbladder. Pancreas: Stable pancreas with mild enlargement of the main pancreatic duct. Spleen: Negative. Adrenals/Urinary Tract: Normal adrenal glands. Bilateral renal enhancement and contrast excretion is symmetric and within normal limits. Diminutive and unremarkable bladder. No gas within the bladder. Stomach/Bowel: In the pelvis the rectum is decompressed and appears normal. There are multiple thickened, hyperenhancing and fluid dilated small bowel loops in the deep pelvis (series 2, image 71) up to 39 millimeters diameter. The terminal ileum is severely thickened and inflamed but nondilated (series 2, image 68 and coronal image 31. There is similar abnormal soft tissue thickening and inflammation arising from the tip of the cecum, possibly related to the appendix, which tracks inferiorly into the right pelvis and includes a 3-4 centimeter area of spiculated and enhancing fluid on coronal image 37. The remainder of the cecum is less inflamed, and the ascending colon has a more normal appearance. The upstream small bowel loops are nondilated and most are decompressed. Some of the right pelvic inflammation is inseparable from the sigmoid colon on series 2, image 59, as it is an adjacent distal small bowel  loop on that same image. The proximal sigmoid, descending and transverse colon appear within normal limits. Stomach and duodenum appear within normal limits. No free fluid. No free air. Vascular/Lymphatic: Major arterial structures are patent and appear normal. Portal venous system is patent. Lymphadenopathy in the right lower quadrant in the region of the inflamed distal small bowel and cecum. No other lymphadenopathy. Reproductive: Negative, retroverted uterus. Other: No definite pelvic free fluid. Musculoskeletal: No acute osseous abnormality identified. Lumbar facet degeneration. Chronic degeneration at the pubic symphysis greater on the right. IMPRESSION: 1. Severely inflamed distal small bowel, terminal ileum, and cecal tip compatible with progressed Crohn disease since last year. Regional reactive appearing lymphadenopathy. New enhancing and irregular inflammatory fistula  arising from the tip of the cecum and/or appendix tracking into the deep pelvis (coronal image 38). Difficult to exclude early fistulization also to the sigmoid colon in that same region. 2. No bowel obstruction, free air or free fluid. Electronically Signed   By: Genevie Ann M.D.   On: 09/07/2018 01:29      Impression / Plan:   Katelyn Garrett is a 53 y.o. African-American female with stricturing small bowel Crohn's, enteric fistula with localized fluid collection communicating to pelvis as well as ? Colocolonic fistula.  Clearly, patient's disease has progressed in last 1 year leading to fistula in the absence of treatment.  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  Patient will need multidisciplinary approach involving colorectal surgery plus interventional radiology plus an IBD specialist  Do not recommend steroids at this time Recommend Zosyn N.p.o. except meds and ice chips Recommend transfer to  tertiary care center such as [facility] or [facility] or [facility] where colorectal surgeon is available Pain control, antiemetics as needed Continue maintenance IV fluids  Thank you for involving me in the care of this patient.      LOS: 0 days   Sherri Sear, MD  09/07/2018, 4:36 PM   Note: This dictation was prepared with Dragon dictation along with smaller phrase technology. Any transcriptional errors that result from this process are unintentional.

## 2018-09-07 NOTE — ED Notes (Signed)
Transport to floor room 129.AS

## 2018-09-07 NOTE — Progress Notes (Signed)
Patient briefly seen and examined this morning.  GI consultation pending continue current management as per admitting MD.

## 2018-09-07 NOTE — Progress Notes (Signed)
Patient transported to [facility] via [facility]. Patient reports no complaints at this time.

## 2018-09-07 NOTE — Plan of Care (Deleted)

## 2018-09-07 NOTE — Consult Note (Signed)
Pharmacy Antibiotic Note  MILICA GULLY is a 53 y.o. female admitted on 09/07/2018 with Intraabdominal infection.  Pharmacy has been consulted for Pip/tazo dosing.  Plan: Zosyn 3.375g IV q8h (4 hour infusion).  Height: 5' 7.5" (171.5 cm) Weight: 159 lb 11.2 oz (72.4 kg) IBW/kg (Calculated) : 62.75  Temp (24hrs), Avg:97.9 F (36.6 C), Min:97.3 F (36.3 C), Max:98.4 F (36.9 C)  Recent Labs  Lab 09/07/18 0015  WBC 9.8  CREATININE 0.59    Estimated Creatinine Clearance: 83.4 mL/min (by C-G formula based on SCr of 0.59 mg/dL).    No Known Allergies  Antimicrobials this admission: 5/1 pip/azo >>  5/1 metronidazole  >> 5/1  Dose adjustments this admission: None  Microbiology results: None  Thank you for allowing pharmacy to be a part of this patient's care.  Oswald Hillock, PharmD, BCPS 09/07/2018 4:10 PM

## 2018-09-07 NOTE — Progress Notes (Signed)
Pt to transfer to [facility] via [facility] . Next on list from [facility].  A/o no c/o pain.  Npo  Since 1500.  Report called to  [facility] at [facility] . Pt to go to room 7105  7 neuro science  Unit.

## 2018-09-07 NOTE — ED Provider Notes (Signed)
[facility] Emergency Department Provider Note   First MD Initiated Contact with Patient 09/07/18 0005     (approximate)  I have reviewed the triage vital signs and the nursing notes.   HISTORY  Chief Complaint    HPI Katelyn Garrett is a 53 y.o. female with below list of previous medical conditions including Crohn's disease and partial small bowel obstruction presents to the emergency department with 10 out of 10 right lower quadrant abdominal pain with accompanying nausea and no bowel movements x3 days.        Past Medical History:  Diagnosis Date  . Chronic constipation   . Crohn's colitis [facility])     Patient Active Problem List   Diagnosis Date Noted  . Partial small bowel obstruction ([place])   . 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 Abdominal pain 08/25/2017  . Right lower quadrant abdominal pain   . Intussusception intestine ([place]) 10/02/2016    Past Surgical History:  Procedure Laterality Date  . COLONOSCOPY N/A 08/28/2017   Procedure: COLONOSCOPY;  Surgeon: Jonathon Bellows, MD;  Location: [facility] ENDOSCOPY;  Service: Gastroenterology;  Laterality: N/A;  . TUBAL LIGATION      Prior to Admission medications   Medication Sig Start Date End Date Taking? Authorizing Provider  HYDROcodone-acetaminophen (NORCO/VICODIN) 5-325 MG tablet Take 1-2 tablets by mouth every 4 (four) hours as needed for moderate pain. 08/28/17   Dustin Flock, MD  penicillin v potassium (VEETID) 500 MG tablet Take 1 tablet (500 mg total) by mouth 4 (four) times daily. 08/13/18   Earleen Newport, MD  polyethylene glycol [facility]) packet Take 17 g by mouth daily. 08/28/17   Dustin Flock, MD  predniSONE (DELTASONE) 10 MG tablet Take 4 tablets (40 mg total) by mouth daily. As directed see prescription 08/28/17   Dustin Flock, MD  predniSONE (STERAPRED UNI-PAK 21 TAB) 10 MG (21) TBPK tablet Dispense steroid taper pack as directed 08/13/18   Earleen Newport, MD    Allergies Patient has no known allergies.  No family history on file.  Social History Social History   Tobacco Use  . Smoking status: Current Every Day Smoker  . Smokeless tobacco: Never Used  Substance Use Topics  . Alcohol use: No  . Drug use: Yes    Types: Marijuana    Comment: Occ    Review of Systems Constitutional: No fever/chills Eyes: No visual changes. ENT: No sore throat. Cardiovascular: Denies chest pain. Respiratory: Denies shortness of breath. Gastrointestinal: No abdominal pain.  No nausea, no vomiting.  No diarrhea.  No constipation. Genitourinary: Negative for dysuria. Musculoskeletal: Negative for neck pain.  Negative for back pain. Integumentary: Negative for rash. Neurological: Negative for headaches, focal weakness or numbness.   ____________________________________________   PHYSICAL EXAM:  VITAL SIGNS: ED Triage Vitals  Enc Vitals Group     BP 09/07/18 0002 129/86     Pulse Rate 09/07/18 0002 93     Resp 09/07/18 0002 20     Temp 09/07/18 0002 98.4 F (36.9 C)     Temp Source 09/07/18 0002 Oral     SpO2 09/07/18 0002 97 %     Weight 09/06/18 2359 76.2 kg (168 lb)     Height 09/06/18 2359 1.702 m (5' 7" )     Head Circumference --      Peak Flow --      Pain Score 09/06/18 2359 8     Pain Loc --  Pain Edu? --      Excl. in [place]? --     Constitutional: Alert and oriented. Well appearing and in no acute distress. Eyes: Conjunctivae are normal. PERRL. EOMI. Mouth/Throat: Mucous membranes are moist. Oropharynx non-erythematous. Neck: No stridor.   Cardiovascular: Normal rate, regular rhythm. Good peripheral circulation. Grossly normal heart sounds. Respiratory: Normal respiratory effort.  No retractions. No audible wheezing. Gastrointestinal: Right lower quadrant tenderness to palpation.  No distention.  Musculoskeletal: No lower extremity tenderness nor edema. No gross deformities of extremities. Neurologic:  Normal speech  and language. No gross focal neurologic deficits are appreciated.  Skin:  Skin is warm, dry and intact. No rash noted. Psychiatric: Mood and affect are normal. Speech and behavior are normal.  ____________________________________________   LABS (all labs ordered are listed, but only abnormal results are displayed)  Labs Reviewed  CBC WITH DIFFERENTIAL/PLATELET - Abnormal; Notable for the following components:      Result Value   RBC 5.78 (*)    Hemoglobin 11.9 (*)    MCV 64.9 (*)    MCH 20.6 (*)    RDW 15.8 (*)    All other components within normal limits  COMPREHENSIVE METABOLIC PANEL - Abnormal; Notable for the following components:   Glucose, Bld 125 (*)    All other components within normal limits  LIPASE, BLOOD     RADIOLOGY I, Arthur N Rylee Nuzum, personally viewed and evaluated these images (plain radiographs) as part of my medical decision making, as well as reviewing the written report by the radiologist.  ED MD interpretation: Severely inflamed distal small bowel terminal ileum with possible fistula extending into the pelvis from the cecum/appendix per radiologist on CT scan interpretation.  Official radiology report(s): Ct Abdomen Pelvis W Contrast  Result Date: 09/07/2018 CLINICAL DATA:  53 year old female with Crohn disease. Right lower abdominal pain and nausea. EXAM: CT ABDOMEN AND PELVIS WITH CONTRAST TECHNIQUE: Multidetector CT imaging of the abdomen and pelvis was performed using the standard protocol following bolus administration of intravenous contrast. CONTRAST:  165m OMNIPAQUE IOHEXOL 300 MG/ML  SOLN COMPARISON:  CT Abdomen and Pelvis 08/25/2017 and earlier. FINDINGS: Lower chest: Similar low lung volumes and minor lung base atelectasis. No cardiomegaly, pericardial or pleural effusion. Hepatobiliary: Negative liver and gallbladder. Pancreas: Stable pancreas with mild enlargement of the main pancreatic duct. Spleen: Negative. Adrenals/Urinary Tract: Normal adrenal  glands. Bilateral renal enhancement and contrast excretion is symmetric and within normal limits. Diminutive and unremarkable bladder. No gas within the bladder. Stomach/Bowel: In the pelvis the rectum is decompressed and appears normal. There are multiple thickened, hyperenhancing and fluid dilated small bowel loops in the deep pelvis (series 2, image 71) up to 39 millimeters diameter. The terminal ileum is severely thickened and inflamed but nondilated (series 2, image 68 and coronal image 31. There is similar abnormal soft tissue thickening and inflammation arising from the tip of the cecum, possibly related to the appendix, which tracks inferiorly into the right pelvis and includes a 3-4 centimeter area of spiculated and enhancing fluid on coronal image 37. The remainder of the cecum is less inflamed, and the ascending colon has a more normal appearance. The upstream small bowel loops are nondilated and most are decompressed. Some of the right pelvic inflammation is inseparable from the sigmoid colon on series 2, image 59, as it is an adjacent distal small bowel loop on that same image. The proximal sigmoid, descending and transverse colon appear within normal limits. Stomach and duodenum appear within normal limits.  No free fluid. No free air. Vascular/Lymphatic: Major arterial structures are patent and appear normal. Portal venous system is patent. Lymphadenopathy in the right lower quadrant in the region of the inflamed distal small bowel and cecum. No other lymphadenopathy. Reproductive: Negative, retroverted uterus. Other: No definite pelvic free fluid. Musculoskeletal: No acute osseous abnormality identified. Lumbar facet degeneration. Chronic degeneration at the pubic symphysis greater on the right. IMPRESSION: 1. Severely inflamed distal small bowel, terminal ileum, and cecal tip compatible with progressed Crohn disease since last year. Regional reactive appearing lymphadenopathy. New enhancing and  irregular inflammatory fistula arising from the tip of the cecum and/or appendix tracking into the deep pelvis (coronal image 38). Difficult to exclude early fistulization also to the sigmoid colon in that same region. 2. No bowel obstruction, free air or free fluid. Electronically Signed   By: Genevie Ann M.D.   On: 09/07/2018 01:29      Procedures   ____________________________________________   INITIAL IMPRESSION / MDM / ASSESSMENT AND PLAN / ED COURSE  As part of my medical decision making, I reviewed the following data within the electronic MEDICAL RECORD NUMBER   53 year old female presenting with above-stated history and physical exam concerning for possible Crohn's exacerbation, fistula, less likely appendicitis, diverticulitis.  As such CT scan of the abdomen pelvis was performed which revealed severely inflamed terminal small bowel with possible fistula formation.  Patient was given IV morphine 4 mg shortly after arrival with improvement of pain current pain score 2 out of 10 in addition patient was given IV Solu-Medrol 125 mg and Flagyl 500 mg IV.  Patient discussed with Dr. Jannifer Franklin for hospital admission further evaluation and management.     *Dolores A Soulliere was evaluated in Emergency Department on 09/07/2018 for the symptoms described in the history of present illness. She was evaluated in the context of the global COVID-19 pandemic, which necessitated consideration that the patient might be at risk for infection with the SARS-CoV-2 virus that causes COVID-19. Institutional protocols and algorithms that pertain to the evaluation of patients at risk for COVID-19 are in a state of rapid change based on information released by regulatory bodies including the CDC and federal and state organizations. These policies and algorithms were followed during the patient's care in the ED.*    ____________________________________________  FINAL CLINICAL IMPRESSION(S) / ED DIAGNOSES  Final diagnoses:   Exacerbation of Crohn's disease with fistula ([place])     MEDICATIONS GIVEN DURING THIS VISIT:  Medications  metroNIDAZOLE (FLAGYL) IVPB 500 mg (has no administration in time range)  morphine 4 MG/ML injection 4 mg (4 mg Intravenous Given 09/07/18 0022)  ondansetron (ZOFRAN) injection 4 mg (4 mg Intravenous Given 09/07/18 0022)  iohexol (OMNIPAQUE) 300 MG/ML solution 100 mL (100 mLs Intravenous Contrast Given 09/07/18 0058)  methylPREDNISolone sodium succinate (SOLU-MEDROL) 125 mg/2 mL injection 125 mg (125 mg Intravenous Given 09/07/18 0211)     ED Discharge Orders    None       Note:  This document was prepared using Dragon voice recognition software and may include unintentional dictation errors.   Gregor Hams, MD 09/07/18 [phone]

## 2018-09-07 NOTE — ED Notes (Signed)
.. ED TO INPATIENT HANDOFF REPORT  ED Nurse Name and Phone #: Deneise Lever [phone]  S Name/Age/Gender Katelyn Garrett 53 y.o. female Room/Bed: ED15A/ED15A  Code Status   Code Status: Prior  Home/SNF/Other Home Patient oriented to: self, place, time and situation Is this baseline? Yes   Triage Complete: Triage complete  Chief Complaint  Constipation  Triage Note Patient ambulatory to triage with steady gait, without difficulty or distress noted, mask in place; pt reports hx Chron's; no BM in several days, having rt lower abd pain accomp by nausea   Allergies No Known Allergies  Level of Care/Admitting Diagnosis ED Disposition    ED Disposition Condition [place]: [place] [[id number]]  Level of Care: Med-Surg [16]  Covid Evaluation: N/A  Diagnosis: Crohn's colitis, with fistula [facility]) [[id number]]  Admitting Physician: Harrie Foreman [[id number]]  Attending Physician: Harrie Foreman [[id number]]  Estimated length of stay: past midnight tomorrow  Certification:: I certify this patient will need inpatient services for at least 2 midnights  PT Class (Do Not Modify): Inpatient [101]  PT Acc Code (Do Not Modify): Private [1]       B Medical/Surgery History Past Medical History:  Diagnosis Date  . Chronic constipation   . Crohn's colitis [facility])    Past Surgical History:  Procedure Laterality Date  . COLONOSCOPY N/A 08/28/2017   Procedure: COLONOSCOPY;  Surgeon: Jonathon Bellows, MD;  Location: [facility] ENDOSCOPY;  Service: Gastroenterology;  Laterality: N/A;  . TUBAL LIGATION       A IV Location/Drains/Wounds Patient Lines/Drains/Airways Status   Active Line/Drains/Airways    Name:   Placement date:   Placement time:   Site:   Days:   Peripheral IV 09/07/18 Left Antecubital   09/07/18    0024    Antecubital   less than 1   Airway   08/28/17    1048     375          Intake/Output Last 24 hours No intake or output data in the 24  hours ending 09/07/18 0230  Labs/Imaging Results for orders placed or performed during the hospital encounter of 09/07/18 (from the past 48 hour(s))  CBC with Differential     Status: Abnormal   Collection Time: 09/07/18 12:15 AM  Result Value Ref Range   WBC 9.8 4.0 - 10.5 K/uL   RBC 5.78 (H) 3.87 - 5.11 MIL/uL   Hemoglobin 11.9 (L) 12.0 - 15.0 g/dL   HCT 37.5 36.0 - 46.0 %   MCV 64.9 (L) 80.0 - 100.0 fL   MCH 20.6 (L) 26.0 - 34.0 pg   MCHC 31.7 30.0 - 36.0 g/dL   RDW 15.8 (H) 11.5 - 15.5 %   Platelets 269 150 - 400 K/uL   nRBC 0.0 0.0 - 0.2 %   Neutrophils Relative % 64 %   Neutro Abs 6.3 1.7 - 7.7 K/uL   Lymphocytes Relative 26 %   Lymphs Abs 2.6 0.7 - 4.0 K/uL   Monocytes Relative 8 %   Monocytes Absolute 0.8 0.1 - 1.0 K/uL   Eosinophils Relative 2 %   Eosinophils Absolute 0.2 0.0 - 0.5 K/uL   Basophils Relative 0 %   Basophils Absolute 0.0 0.0 - 0.1 K/uL   Immature Granulocytes 0 %   Abs Immature Granulocytes 0.03 0.00 - 0.07 K/uL    Comment: Performed at [facility], [address] [place], [place] [address]  Comprehensive metabolic panel  Status: Abnormal   Collection Time: 09/07/18 12:15 AM  Result Value Ref Range   Sodium 141 135 - 145 mmol/L   Potassium 3.8 3.5 - 5.1 mmol/L   Chloride 107 98 - 111 mmol/L   CO2 24 22 - 32 mmol/L   Glucose, Bld 125 (H) 70 - 99 mg/dL   BUN 11 6 - 20 mg/dL   Creatinine, Ser 0.59 0.44 - 1.00 mg/dL   Calcium 9.1 8.9 - 10.3 mg/dL   Total Protein 7.8 6.5 - 8.1 g/dL   Albumin 4.0 3.5 - 5.0 g/dL   AST 19 15 - 41 U/L   ALT 14 0 - 44 U/L   Alkaline Phosphatase 108 38 - 126 U/L   Total Bilirubin 0.7 0.3 - 1.2 mg/dL   GFR calc non Af Amer >60 >60 mL/min   GFR calc Af Amer >60 >60 mL/min   Anion gap 10 5 - 15    Comment: Performed at [facility], [address] [place], [place] [address]  Lipase, blood     Status: None   Collection Time: 09/07/18 12:15 AM  Result Value Ref Range   Lipase 23 11 - 51 U/L     Comment: Performed at [facility], [address], [place], [place] [address]   Ct Abdomen Pelvis W Contrast  Result Date: 09/07/2018 CLINICAL DATA:  53 year old female with Crohn disease. Right lower abdominal pain and nausea. EXAM: CT ABDOMEN AND PELVIS WITH CONTRAST TECHNIQUE: Multidetector CT imaging of the abdomen and pelvis was performed using the standard protocol following bolus administration of intravenous contrast. CONTRAST:  167m OMNIPAQUE IOHEXOL 300 MG/ML  SOLN COMPARISON:  CT Abdomen and Pelvis 08/25/2017 and earlier. FINDINGS: Lower chest: Similar low lung volumes and minor lung base atelectasis. No cardiomegaly, pericardial or pleural effusion. Hepatobiliary: Negative liver and gallbladder. Pancreas: Stable pancreas with mild enlargement of the main pancreatic duct. Spleen: Negative. Adrenals/Urinary Tract: Normal adrenal glands. Bilateral renal enhancement and contrast excretion is symmetric and within normal limits. Diminutive and unremarkable bladder. No gas within the bladder. Stomach/Bowel: In the pelvis the rectum is decompressed and appears normal. There are multiple thickened, hyperenhancing and fluid dilated small bowel loops in the deep pelvis (series 2, image 71) up to 39 millimeters diameter. The terminal ileum is severely thickened and inflamed but nondilated (series 2, image 68 and coronal image 31. There is similar abnormal soft tissue thickening and inflammation arising from the tip of the cecum, possibly related to the appendix, which tracks inferiorly into the right pelvis and includes a 3-4 centimeter area of spiculated and enhancing fluid on coronal image 37. The remainder of the cecum is less inflamed, and the ascending colon has a more normal appearance. The upstream small bowel loops are nondilated and most are decompressed. Some of the right pelvic inflammation is inseparable from the sigmoid colon on series 2, image 59, as it is an adjacent distal small bowel  loop on that same image. The proximal sigmoid, descending and transverse colon appear within normal limits. Stomach and duodenum appear within normal limits. No free fluid. No free air. Vascular/Lymphatic: Major arterial structures are patent and appear normal. Portal venous system is patent. Lymphadenopathy in the right lower quadrant in the region of the inflamed distal small bowel and cecum. No other lymphadenopathy. Reproductive: Negative, retroverted uterus. Other: No definite pelvic free fluid. Musculoskeletal: No acute osseous abnormality identified. Lumbar facet degeneration. Chronic degeneration at the pubic symphysis greater on the right. IMPRESSION: 1. Severely inflamed  distal small bowel, terminal ileum, and cecal tip compatible with progressed Crohn disease since last year. Regional reactive appearing lymphadenopathy. New enhancing and irregular inflammatory fistula arising from the tip of the cecum and/or appendix tracking into the deep pelvis (coronal image 38). Difficult to exclude early fistulization also to the sigmoid colon in that same region. 2. No bowel obstruction, free air or free fluid. Electronically Signed   By: Genevie Ann M.D.   On: 09/07/2018 01:29    Pending Labs [employer] (From admission, onward)    Start     Ordered   Signed and Held  TSH  Add-on,   R     Signed and Held   Signed and Held  Hemoglobin A1c  Add-on,   R     Signed and Held          Vitals/Pain Today's Vitals   09/07/18 0021 09/07/18 0030 09/07/18 0132 09/07/18 0132  BP:  123/84    Pulse:  79    Resp:  18    Temp:      TempSrc:      SpO2:  94%    Weight:      Height:      PainSc: 9   2  2      Isolation Precautions No active isolations  Medications Medications  metroNIDAZOLE (FLAGYL) IVPB 500 mg (500 mg Intravenous New Bag/Given 09/07/18 0213)  morphine 4 MG/ML injection 4 mg (4 mg Intravenous Given 09/07/18 0022)  ondansetron (ZOFRAN) injection 4 mg (4 mg Intravenous Given 09/07/18 0022)   iohexol (OMNIPAQUE) 300 MG/ML solution 100 mL (100 mLs Intravenous Contrast Given 09/07/18 0058)  methylPREDNISolone sodium succinate (SOLU-MEDROL) 125 mg/2 mL injection 125 mg (125 mg Intravenous Given 09/07/18 0211)    Mobility walks Low fall risk   Focused Assessments GI   R Recommendations: See Admitting Provider Note  Report given to:   Additional Notes:

## 2018-09-07 NOTE — Plan of Care (Signed)
  Problem: Education: Goal: Knowledge of General Education information will improve Description Including pain rating scale, medication(s)/side effects and non-pharmacologic comfort measures 09/07/2018 0420 by Wallace Going, RN Outcome: Progressing 09/07/2018 0419 by Wallace Going, RN Outcome: Progressing   Problem: Health Behavior/Discharge Planning: Goal: Ability to manage health-related needs will improve 09/07/2018 0420 by Wallace Going, RN Outcome: Progressing 09/07/2018 0419 by Wallace Going, RN Outcome: Progressing   Problem: Clinical Measurements: Goal: Ability to maintain clinical measurements within normal limits will improve 09/07/2018 0420 by Wallace Going, RN Outcome: Progressing 09/07/2018 0419 by Wallace Going, RN Outcome: Progressing Goal: Will remain free from infection 09/07/2018 0420 by Wallace Going, RN Outcome: Progressing 09/07/2018 0419 by Wallace Going, RN Outcome: Progressing Goal: Diagnostic test results will improve 09/07/2018 0420 by Wallace Going, RN Outcome: Progressing 09/07/2018 0419 by Wallace Going, RN Outcome: Progressing Goal: Respiratory complications will improve 09/07/2018 0420 by Wallace Going, RN Outcome: Progressing 09/07/2018 0419 by Wallace Going, RN Outcome: Progressing Goal: Cardiovascular complication will be avoided 09/07/2018 0420 by Wallace Going, RN Outcome: Progressing 09/07/2018 0419 by Wallace Going, RN Outcome: Progressing   Problem: Activity: Goal: Risk for activity intolerance will decrease 09/07/2018 0420 by Wallace Going, RN Outcome: Progressing 09/07/2018 0419 by Wallace Going, RN Outcome: Progressing   Problem: Nutrition: Goal: Adequate nutrition will be maintained 09/07/2018 0420 by Wallace Going, RN Outcome: Progressing 09/07/2018 0419 by Wallace Going, RN Outcome: Progressing   Problem: Coping: Goal: Level of anxiety will decrease 09/07/2018 0420 by Wallace Going, RN Outcome:  Progressing 09/07/2018 0419 by Wallace Going, RN Outcome: Progressing   Problem: Elimination: Goal: Will not experience complications related to bowel motility 09/07/2018 0420 by Wallace Going, RN Outcome: Progressing 09/07/2018 0419 by Wallace Going, RN Outcome: Progressing Goal: Will not experience complications related to urinary retention 09/07/2018 0420 by Wallace Going, RN Outcome: Progressing 09/07/2018 0419 by Wallace Going, RN Outcome: Progressing   Problem: Pain Managment: Goal: General experience of comfort will improve 09/07/2018 0420 by Wallace Going, RN Outcome: Progressing 09/07/2018 0419 by Wallace Going, RN Outcome: Progressing   Problem: Safety: Goal: Ability to remain free from injury will improve 09/07/2018 0420 by Wallace Going, RN Outcome: Progressing 09/07/2018 0419 by Wallace Going, RN Outcome: Progressing

## 2018-09-07 NOTE — H&P (Signed)
Katelyn Garrett is an 53 y.o. female.   Chief Complaint: Abdominal pain HPI: The patient with past medical history of Crohn's disease presents to the emergency department complaining of abdominal pain.  The patient reports nausea and vomiting starting 5 days prior to admission.  Both of those symptoms eased as her abdominal pain worsened.  She denies blood in her stool or lesions of her mouth or skin.  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  The patient was given Solu-Medrol in the emergency department prior to the hospitalist service being called for further management.  Past Medical History:  Diagnosis Date  . Chronic constipation   . Crohn's colitis [facility])     Past Surgical History:  Procedure Laterality Date  . COLONOSCOPY N/A 08/28/2017   Procedure: COLONOSCOPY;  Surgeon: Jonathon Bellows, MD;  Location: [facility] ENDOSCOPY;  Service: Gastroenterology;  Laterality: N/A;  . TUBAL LIGATION      No family history on file. Social History:  reports that she has been smoking. She has never used smokeless tobacco. She reports current drug use. Drug: Marijuana. She reports that she does not drink alcohol.  Allergies: No Known Allergies  Medications Prior to Admission  Medication Sig Dispense Refill  . naproxen sodium (ALEVE) 220 MG tablet Take 220 mg by mouth every 8 (eight) hours as needed.    . predniSONE (DELTASONE) 10 MG tablet Take 4 tablets (40 mg total) by mouth daily. As directed see prescription 30 tablet 0    Results for orders placed or performed during the hospital encounter of 09/07/18 (from the past 48 hour(s))  CBC with Differential     Status: Abnormal   Collection Time: 09/07/18 12:15 AM  Result Value Ref Range   WBC 9.8 4.0 - 10.5 K/uL   RBC 5.78 (H) 3.87 - 5.11 MIL/uL   Hemoglobin 11.9 (L) 12.0 - 15.0 g/dL   HCT 37.5 36.0 - 46.0 %   MCV 64.9 (L) 80.0 - 100.0 fL   MCH 20.6 (L) 26.0 -  34.0 pg   MCHC 31.7 30.0 - 36.0 g/dL   RDW 15.8 (H) 11.5 - 15.5 %   Platelets 269 150 - 400 K/uL   nRBC 0.0 0.0 - 0.2 %   Neutrophils Relative % 64 %   Neutro Abs 6.3 1.7 - 7.7 K/uL   Lymphocytes Relative 26 %   Lymphs Abs 2.6 0.7 - 4.0 K/uL   Monocytes Relative 8 %   Monocytes Absolute 0.8 0.1 - 1.0 K/uL   Eosinophils Relative 2 %   Eosinophils Absolute 0.2 0.0 - 0.5 K/uL   Basophils Relative 0 %   Basophils Absolute 0.0 0.0 - 0.1 K/uL   Immature Granulocytes 0 %   Abs Immature Granulocytes 0.03 0.00 - 0.07 K/uL    Comment: Performed at [facility], [address] [place], [place] [address]  Comprehensive metabolic panel     Status: Abnormal   Collection Time: 09/07/18 12:15 AM  Result Value Ref Range   Sodium 141 135 - 145 mmol/L   Potassium 3.8 3.5 - 5.1 mmol/L   Chloride 107 98 - 111 mmol/L   CO2 24 22 - 32 mmol/L   Glucose, Bld 125 (H) 70 - 99 mg/dL   BUN 11 6 - 20 mg/dL   Creatinine, Ser 0.59 0.44 - 1.00 mg/dL   Calcium 9.1 8.9 - 10.3 mg/dL   Total Protein  7.8 6.5 - 8.1 g/dL   Albumin 4.0 3.5 - 5.0 g/dL   AST 19 15 - 41 U/L   ALT 14 0 - 44 U/L   Alkaline Phosphatase 108 38 - 126 U/L   Total Bilirubin 0.7 0.3 - 1.2 mg/dL   GFR calc non Af Amer >60 >60 mL/min   GFR calc Af Amer >60 >60 mL/min   Anion gap 10 5 - 15    Comment: Performed at [facility], [address] [place], [place] [address]  Lipase, blood     Status: None   Collection Time: 09/07/18 12:15 AM  Result Value Ref Range   Lipase 23 11 - 51 U/L    Comment: Performed at [facility], [address] [place], [place] [address]  TSH     Status: None   Collection Time: 09/07/18 12:15 AM  Result Value Ref Range   TSH 3.133 0.350 - 4.500 uIU/mL    Comment: Performed by a 3rd Generation assay with a functional sensitivity of <=0.01 uIU/mL. Performed at [facility], [place], [place] [address]    Ct Abdomen Pelvis W Contrast  Result Date:  09/07/2018 CLINICAL DATA:  53 year old female with Crohn disease. Right lower abdominal pain and nausea. EXAM: CT ABDOMEN AND PELVIS WITH CONTRAST TECHNIQUE: Multidetector CT imaging of the abdomen and pelvis was performed using the standard protocol following bolus administration of intravenous contrast. CONTRAST:  142m OMNIPAQUE IOHEXOL 300 MG/ML  SOLN COMPARISON:  CT Abdomen and Pelvis 08/25/2017 and earlier. FINDINGS: Lower chest: Similar low lung volumes and minor lung base atelectasis. No cardiomegaly, pericardial or pleural effusion. Hepatobiliary: Negative liver and gallbladder. Pancreas: Stable pancreas with mild enlargement of the main pancreatic duct. Spleen: Negative. Adrenals/Urinary Tract: Normal adrenal glands. Bilateral renal enhancement and contrast excretion is symmetric and within normal limits. Diminutive and unremarkable bladder. No gas within the bladder. Stomach/Bowel: In the pelvis the rectum is decompressed and appears normal. There are multiple thickened, hyperenhancing and fluid dilated small bowel loops in the deep pelvis (series 2, image 71) up to 39 millimeters diameter. The terminal ileum is severely thickened and inflamed but nondilated (series 2, image 68 and coronal image 31. There is similar abnormal soft tissue thickening and inflammation arising from the tip of the cecum, possibly related to the appendix, which tracks inferiorly into the right pelvis and includes a 3-4 centimeter area of spiculated and enhancing fluid on coronal image 37. The remainder of the cecum is less inflamed, and the ascending colon has a more normal appearance. The upstream small bowel loops are nondilated and most are decompressed. Some of the right pelvic inflammation is inseparable from the sigmoid colon on series 2, image 59, as it is an adjacent distal small bowel loop on that same image. The proximal sigmoid, descending and transverse colon appear within normal limits. Stomach and duodenum appear  within normal limits. No free fluid. No free air. Vascular/Lymphatic: Major arterial structures are patent and appear normal. Portal venous system is patent. Lymphadenopathy in the right lower quadrant in the region of the inflamed distal small bowel and cecum. No other lymphadenopathy. Reproductive: Negative, retroverted uterus. Other: No definite pelvic free fluid. Musculoskeletal: No acute osseous abnormality identified. Lumbar facet degeneration. Chronic degeneration at the pubic symphysis greater on the right. IMPRESSION: 1. Severely inflamed distal small bowel, terminal ileum, and cecal tip compatible with progressed Crohn disease since last year. Regional reactive appearing lymphadenopathy. New enhancing and irregular inflammatory fistula arising  from the tip of the cecum and/or appendix tracking into the deep pelvis (coronal image 38). Difficult to exclude early fistulization also to the sigmoid colon in that same region. 2. No bowel obstruction, free air or free fluid. Electronically Signed   By: Genevie Ann M.D.   On: 09/07/2018 01:29    Review of Systems  Constitutional: Negative for chills and fever.  HENT: Negative for sore throat and tinnitus.   Eyes: Negative for blurred vision and redness.  Respiratory: Negative for cough and shortness of breath.   Cardiovascular: Negative for chest pain, palpitations, orthopnea and PND.  Gastrointestinal: Positive for abdominal pain, constipation, nausea and vomiting (resolved). Negative for blood in stool, diarrhea and melena.  Genitourinary: Negative for dysuria, frequency and urgency.  Musculoskeletal: Negative for joint pain and myalgias.  Skin: Negative for rash.       No lesions  Neurological: Negative for speech change, focal weakness and weakness.  Endo/Heme/Allergies: Does not bruise/bleed easily.       No temperature intolerance  Psychiatric/Behavioral: Negative for depression and suicidal ideas.    Blood pressure 132/77, pulse 61,  temperature 98 F (36.7 C), temperature source Oral, resp. rate 19, height 5' 7.5" (1.715 m), weight 72.4 kg, SpO2 99 %. Physical Exam  Vitals reviewed. Constitutional: She is oriented to person, place, and time. She appears well-developed and well-nourished. No distress.  HENT:  Head: Normocephalic and atraumatic.  Mouth/Throat: Oropharynx is clear and moist.  Eyes: Pupils are equal, round, and reactive to light. Conjunctivae and EOM are normal. No scleral icterus.  Neck: Normal range of motion. Neck supple. No JVD present. No tracheal deviation present. No thyromegaly present.  Cardiovascular: Normal rate, regular rhythm and normal heart sounds. Exam reveals no gallop and no friction rub.  No murmur heard. Respiratory: [place] normal and breath sounds normal.  GI: Soft. Bowel sounds are normal. She exhibits no distension and no mass. There is abdominal tenderness. There is no rebound and no guarding.  Genitourinary:    Genitourinary Comments: Deferred   Musculoskeletal: Normal range of motion.        General: No edema.  Lymphadenopathy:    She has no cervical adenopathy.  Neurological: She is alert and oriented to person, place, and time. No cranial nerve deficit. She exhibits normal muscle tone.  Skin: Skin is warm and dry. No rash noted. No erythema.  Psychiatric: She has a normal mood and affect. Her behavior is normal. Judgment and thought content normal.     Assessment/Plan This is a 53 year old female admitted for colitis. 1.  Colitis: Involving distal small bowel, terminal ileum and the tip of the cecum.  Continue corticosteroid therapy.  No signs or symptoms of sepsis at this time.  Continue antibiotics for enteric coverage.  Consult gastroenterology. 2.  Crohn's disease: Complicated by fistula formation.  Consult general surgery.  The patient does not have an acute abdomen at this time nor does she have rectal bleeding. 3.  DVT prophylaxis: SCDs 4.  GI prophylaxis: None The  patient is a full code.  Time spent on admission orders and patient care approximately 45 minutes  Harrie Foreman, MD 09/07/2018, 7:00 AM

## 2018-09-07 NOTE — Discharge Summary (Signed)
[name]    MR#:  [id number]  DATE OF BIRTH:  [birth year]  DATE OF ADMISSION:  09/07/2018 ADMITTING PHYSICIAN: Katelyn Foreman, MD  DATE OF DISCHARGE: 09/07/2018  PRIMARY CARE PHYSICIAN: Patient, No Pcp Per    ADMISSION DIAGNOSIS:  Exacerbation of Crohn's disease with fistula ([place]) [K50.913]  DISCHARGE DIAGNOSIS:  Active Problems:   Crohn's colitis, with fistula ([place])   SECONDARY DIAGNOSIS:   Past Medical History:  Diagnosis Date  . Chronic constipation   . Crohn's colitis [facility])     HOSPITAL COURSE:  53 y/o female with Crohn's disease who presented to ED with abdominal pain and found to have fistula on CT [place].  Crohn's with fistula: SHe was evaluated by both GI and surgical services with recommendation to transfer to tertiary care center to colrectal surgery for further management.  She has been accepted at [facility] colorectal services.   DISCHARGE CONDITIONS AND DIET:  Stable NPO  CONSULTS OBTAINED:  Treatment Team:  Olean Ree, MD Lin Landsman, MD  DRUG ALLERGIES:  No Known Allergies  DISCHARGE MEDICATIONS:   Allergies as of 09/07/2018   No Known Allergies     Medication List    STOP taking these medications   naproxen sodium 220 MG tablet Commonly known as:  ALEVE   predniSONE 10 MG tablet Commonly known as:  DELTASONE         Today   CHIEF COMPLAINT:  Ok for tx to [place]:  Blood pressure 138/75, pulse (!) 54, temperature (!) 97.5 F (36.4 C), temperature source Oral, resp. rate 16, height 5' 7.5" (1.715 m), weight 72.4 kg, SpO2 97 %.   REVIEW OF SYSTEMS:  Review of Systems  Constitutional: Negative.  Negative for chills, fever and malaise/fatigue.  HENT: Negative.  Negative for ear discharge, ear pain, hearing loss, nosebleeds and sore throat.   Eyes: Negative.  Negative for blurred vision and pain.  Respiratory: Negative.  Negative for cough,  hemoptysis, shortness of breath and wheezing.   Cardiovascular: Negative.  Negative for chest pain, palpitations and leg swelling.  Gastrointestinal: Positive for abdominal pain, nausea and vomiting. Negative for blood in stool and diarrhea.  Genitourinary: Negative.  Negative for dysuria.  Musculoskeletal: Negative.  Negative for back pain.  Skin: Negative.   Neurological: Negative for dizziness, tremors, speech change, focal weakness, seizures and headaches.  Endo/Heme/Allergies: Negative.  Does not bruise/bleed easily.  Psychiatric/Behavioral: Negative.  Negative for depression, hallucinations and suicidal ideas.     PHYSICAL EXAMINATION:  GENERAL:  53 y.o.-year-old patient lying in the bed with no acute distress.  NECK:  Supple, no jugular venous distention. No thyroid enlargement, no tenderness.  LUNGS: Normal breath sounds bilaterally, no wheezing, rales,rhonchi  No use of accessory muscles of respiration.  CARDIOVASCULAR: S1, S2 normal. No murmurs, rubs, or gallops.  ABDOMEN: Soft, non-tender, non-distended. Bowel sounds present. No organomegaly or mass.  EXTREMITIES: No pedal edema, cyanosis, or clubbing.  PSYCHIATRIC: The patient is alert and oriented x 3.  SKIN: No obvious rash, lesion, or ulcer.   DATA REVIEW:   CBC Recent Labs  Lab 09/07/18 0015  WBC 9.8  HGB 11.9*  HCT 37.5  PLT 269    Chemistries  Recent Labs  Lab 09/07/18 0015  NA 141  K 3.8  CL 107  CO2 24  GLUCOSE 125*  BUN 11  CREATININE 0.59  CALCIUM 9.1  AST 19  ALT  14  ALKPHOS 108  BILITOT 0.7    Cardiac Enzymes No results for input(s): TROPONINI in the last 168 hours.  Microbiology Results  @[email or web address] @  RADIOLOGY:  Ct Abdomen Pelvis W Contrast  Result Date: 09/07/2018 CLINICAL DATA:  53 year old female with Crohn disease. Right lower abdominal pain and nausea. EXAM: CT ABDOMEN AND PELVIS WITH CONTRAST TECHNIQUE: Multidetector CT imaging of the abdomen and pelvis was performed using  the standard protocol following bolus administration of intravenous contrast. CONTRAST:  134m OMNIPAQUE IOHEXOL 300 MG/ML  SOLN COMPARISON:  CT Abdomen and Pelvis 08/25/2017 and earlier. FINDINGS: Lower chest: Similar low lung volumes and minor lung base atelectasis. No cardiomegaly, pericardial or pleural effusion. Hepatobiliary: Negative liver and gallbladder. Pancreas: Stable pancreas with mild enlargement of the main pancreatic duct. Spleen: Negative. Adrenals/Urinary Tract: Normal adrenal glands. Bilateral renal enhancement and contrast excretion is symmetric and within normal limits. Diminutive and unremarkable bladder. No gas within the bladder. Stomach/Bowel: In the pelvis the rectum is decompressed and appears normal. There are multiple thickened, hyperenhancing and fluid dilated small bowel loops in the deep pelvis (series 2, image 71) up to 39 millimeters diameter. The terminal ileum is severely thickened and inflamed but nondilated (series 2, image 68 and coronal image 31. There is similar abnormal soft tissue thickening and inflammation arising from the tip of the cecum, possibly related to the appendix, which tracks inferiorly into the right pelvis and includes a 3-4 centimeter area of spiculated and enhancing fluid on coronal image 37. The remainder of the cecum is less inflamed, and the ascending colon has a more normal appearance. The upstream small bowel loops are nondilated and most are decompressed. Some of the right pelvic inflammation is inseparable from the sigmoid colon on series 2, image 59, as it is an adjacent distal small bowel loop on that same image. The proximal sigmoid, descending and transverse colon appear within normal limits. Stomach and duodenum appear within normal limits. No free fluid. No free air. Vascular/Lymphatic: Major arterial structures are patent and appear normal. Portal venous system is patent. Lymphadenopathy in the right lower quadrant in the region of the  inflamed distal small bowel and cecum. No other lymphadenopathy. Reproductive: Negative, retroverted uterus. Other: No definite pelvic free fluid. Musculoskeletal: No acute osseous abnormality identified. Lumbar facet degeneration. Chronic degeneration at the pubic symphysis greater on the right. IMPRESSION: 1. Severely inflamed distal small bowel, terminal ileum, and cecal tip compatible with progressed Crohn disease since last year. Regional reactive appearing lymphadenopathy. New enhancing and irregular inflammatory fistula arising from the tip of the cecum and/or appendix tracking into the deep pelvis (coronal image 38). Difficult to exclude early fistulization also to the sigmoid colon in that same region. 2. No bowel obstruction, free air or free fluid. Electronically Signed   By: HGenevie AnnM.D.   On: 09/07/2018 01:29      Allergies as of 09/07/2018   No Known Allergies     Medication List    STOP taking these medications   naproxen sodium 220 MG tablet Commonly known as:  ALEVE   predniSONE 10 MG tablet Commonly known as:  DELTASONE         Management plans discussed with the patient and she is in agreement. [facility]  CODE STATUS:     Code Status Orders  (From admission, onward)         Start     Ordered  09/07/18 0315  Full code  Continuous     09/07/18 0314        Code Status History    Date Active Date Inactive Code Status Order ID Comments User Context   08/25/2017 1149 08/28/2017 1943 Full Code [id number]  Dustin Flock, MD Inpatient   10/02/2016 2333 10/03/2016 2141 Full Code [id number]  Vickie Epley, MD ED      TOTAL TIME TAKING CARE OF THIS PATIENT: 37 minutes.    Note: This dictation was prepared with Dragon dictation along with smaller phrase technology. Any transcriptional errors that result from this process are unintentional.  Bettey Costa M.D on 09/07/2018 at 5:11 PM  Between 7am  to 6pm - Pager - [phone] After 6pm go to [email or web address] - password [email or web address] [facility]  Office  [phone]  CC: Primary care physician; Patient, No Pcp Per

## 2018-09-07 NOTE — Consult Note (Addendum)
[facility] SURGICAL CONSULTATION NOTE (initial) - cpt: [id number]   HISTORY OF PRESENT ILLNESS (HPI):  53 y.o. female presented to [facility] early this morning for evaluation of abdominal pain. Patient reports the acute onset of severe RLQ abdominal pain she rated a 10 out of 10. The pain had slowly progressed over the last 4-5 days. She did endorse associated nausea and emesis earlier in the week. No fever, chills, cough, congestion, CP, or SOB. Her last BM was on Monday. She has a history of constipation and takes laxatives and intermittent enemas for this. In April of 2019, she presented with similar and was found to have a bowel obstruction and was diagnosed with Crohn's at that time. This was diagnosed via colonoscopy and biopsy during that admission which also showed stricture of ileocecal valve. She follows Dr [facility] as an outpatient for this and takes 20 mg prednisone daily. Work up in the ED was concerning for colitis and possible fistulization.   Since admission, she reports significant improvement in her abdominal pain. She no longer has any nausea or emesis. She is tolerating full liquid diet currently. No flatus. No other complaints.   Surgery is consulted by hospitalist physician Dr. Marcille Blanco, MD in this context for evaluation and management of colitis with possible fistulization in a patient with likely crohn's flare.   PAST MEDICAL HISTORY (PMH):  Past Medical History:  Diagnosis Date  . Chronic constipation   . Crohn's colitis ([place])      PAST SURGICAL HISTORY (Drew):  Past Surgical History:  Procedure Laterality Date  . COLONOSCOPY N/A 08/28/2017   Procedure: COLONOSCOPY;  Surgeon: Katelyn Bellows, MD;  [facility];  Service: Gastroenterology;  Laterality: N/A;  . TUBAL LIGATION       MEDICATIONS:  Prior to Admission medications   Medication Sig Start Date End Date Taking? Authorizing Provider  naproxen sodium (ALEVE) 220 MG tablet Take 220 mg by mouth  every 8 (eight) hours as needed.   Yes [provider]  predniSONE (DELTASONE) 10 MG tablet Take 4 tablets (40 mg total) by mouth daily. As directed see prescription 08/28/17  Yes Katelyn Flock, MD     ALLERGIES:  No Known Allergies   SOCIAL HISTORY:  Social History   Socioeconomic History  . Marital status: Single    Spouse name: Not on file  . Number of children: Not on file  . Years of education: Not on file  . Highest education level: Not on file  Occupational History  . Not on file  Social Needs  . Financial resource strain: Not on file  . Food insecurity:    Worry: Not on file    Inability: Not on file  . Transportation needs:    Medical: Not on file    Non-medical: Not on file  Tobacco Use  . Smoking status: Current Every Day Smoker  . Smokeless tobacco: Never Used  Substance and Sexual Activity  . Alcohol use: No  . Drug use: Yes    Types: Marijuana    Comment: Occ  . Sexual activity: Not on file  Lifestyle  . Physical activity:    Days per week: Not on file    Minutes per session: Not on file  . Stress: Not on file  Relationships  . Social connections:    Talks on phone: Not on file    Gets together: Not on file    Attends religious service: Not on file    Active member of club  or organization: Not on file    Attends meetings of clubs or organizations: Not on file    Relationship status: Not on file  . Intimate partner violence:    Fear of current or ex partner: Not on file    Emotionally abused: Not on file    Physically abused: Not on file    Forced sexual activity: Not on file  Other Topics Concern  . Not on file  Social History Narrative  . Not on file     FAMILY HISTORY:  No family history on file.    REVIEW OF SYSTEMS:  Review of Systems  Constitutional: Negative for chills and fever.  HENT: Negative for congestion and sore throat.   Respiratory: Negative for cough and shortness of breath.   Cardiovascular: Negative for  chest pain and palpitations.  Gastrointestinal: Positive for abdominal pain, constipation, nausea and vomiting. Negative for blood in stool and diarrhea.  Genitourinary: Negative for dysuria and hematuria.  Neurological: Negative for dizziness and headaches.  All other systems reviewed and are negative.   VITAL SIGNS:  Temp:  [97.3 F (36.3 C)-98.4 F (36.9 C)] 97.3 F (36.3 C) (05/01 0819) Pulse Rate:  [61-93] 71 (05/01 0819) Resp:  [16-20] 20 (05/01 0819) BP: (120-132)/(77-106) 132/106 (05/01 0819) SpO2:  [94 %-99 %] 97 % (05/01 0819) Weight:  [72.4 kg-76.2 kg] 72.4 kg (05/01 0315)     Height: 5' 7.5" (171.5 cm) Weight: 72.4 kg BMI (Calculated): 24.63   INTAKE/OUTPUT:  This shift: No intake/output data recorded.  Last 2 shifts: @IOLAST2SHIFTS @   PHYSICAL EXAM:  Physical Exam Vitals signs and nursing note reviewed.  Constitutional:      General: She is not in acute distress.    Appearance: She is well-developed and normal weight. She is not ill-appearing.  HENT:     Head: Normocephalic and atraumatic.  Eyes:     General: No scleral icterus.    Extraocular Movements: Extraocular movements intact.  Cardiovascular:     Rate and Rhythm: Normal rate and regular rhythm.     Heart sounds: Normal heart sounds. No murmur. No friction rub. No gallop.   Pulmonary:     Effort: Pulmonary effort is normal. No respiratory distress.     Breath sounds: Normal breath sounds. No wheezing or rhonchi.  Abdominal:     General: Abdomen is flat. There is no distension.     Palpations: Abdomen is soft.     Tenderness: There is abdominal tenderness in the right lower quadrant. There is no guarding or rebound.  Genitourinary:    Comments: Deferred Skin:    General: Skin is warm and dry.     Coloration: Skin is not jaundiced or pale.  Neurological:     General: No focal deficit present.     Mental Status: She is alert and oriented to person, place, and time.  Psychiatric:        Mood and  Affect: Mood normal.        Behavior: Behavior normal.       Labs:  CBC Latest Ref Rng & Units 09/07/2018 08/13/2018 09/18/2017  WBC 4.0 - 10.5 K/uL 9.8 7.9 8.9  Hemoglobin 12.0 - 15.0 g/dL 11.9(L) 11.5(L) 11.9(L)  Hematocrit 36.0 - 46.0 % 37.5 36.7 37.9  Platelets 150 - 400 K/uL 269 236 219   CMP Latest Ref Rng & Units 09/07/2018 08/13/2018 08/28/2017  Glucose 70 - 99 mg/dL 125(H) 124(H) 127(H)  BUN 6 - 20 mg/dL 11 12 7   Creatinine  0.44 - 1.00 mg/dL 0.59 0.62 0.61  Sodium 135 - 145 mmol/L 141 142 141  Potassium 3.5 - 5.1 mmol/L 3.8 3.3(L) 3.5  Chloride 98 - 111 mmol/L 107 109 106  CO2 22 - 32 mmol/L 24 25 29   Calcium 8.9 - 10.3 mg/dL 9.1 8.6(L) 8.8(L)  Total Protein 6.5 - 8.1 g/dL 7.8 7.1 -  Total Bilirubin 0.3 - 1.2 mg/dL 0.7 0.4 -  Alkaline Phos 38 - 126 U/L 108 139(H) -  AST 15 - 41 U/L 19 20 -  ALT 0 - 44 U/L 14 18 -     Chart review:   Colonoscopy 08/28/2017 - Dr Vicente Males, MD: IMPRESSION  - Preparation of the colon was fair. - Diverticulosis in the sigmoid colon. 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 - Mucosal ulceration. Biopsied. - Mucosal ulceration. Biopsied. - The examination was otherwise normal on direct and retroflexion views  - Biopsies showed terminal ileitis    Imaging studies:   CT Abdomen/Pelvis (09/07/2018) personally reviewed and radiologist report reviewed:  IMPRESSION: 1. Severely inflamed distal small bowel, terminal ileum, and cecal tip compatible with progressed Crohn disease since last year. Regional reactive appearing lymphadenopathy. New enhancing and irregular inflammatory fistula arising from the tip of the cecum and/or appendix tracking into the deep pelvis (coronal image 38). Difficult to exclude early fistulization also to the sigmoid colon in that same region. 2. No bowel obstruction, free air or free fluid.   Assessment/Plan: (ICD-10's: K50.113) 53 y.o. female with improved RLQ abdominal pain attributable to crohn's colitis now  with possible new fistulization (likely bowel to bowel).   - Okay for diet as tolerates  - pain control prn; antiemetics prn  - continue IV ABx (flagyl)  - monitor abdominal examination; on-going bowel function   - Agree with GI consultation for Crohn's; on prednisone  - No indications for emergent surgical intervention  - Patient will likely benefit from outpatient referral to colorectal surgeon -vs- crohn's surgeon for additional management  - further management per primary service  All of the above findings and recommendations were discussed with the patient, and all of patient's questions were answered to her expressed satisfaction.  Thank you for the opportunity to participate in this patient's care.   -- Edison Simon, PA-C [facility] 09/07/2018, 11:59 AM [phone] M-F: 7am - 4pm

## 2020-02-13 ENCOUNTER — Emergency Department: Payer: Self-pay

## 2020-02-13 ENCOUNTER — Emergency Department
Admission: EM | Admit: 2020-02-13 | Discharge: 2020-02-13 | Disposition: A | Discharge status: Home / Self Care | Payer: Self-pay | Attending: Emergency Medicine | Admitting: Emergency Medicine

## 2020-02-13 ENCOUNTER — Other Ambulatory Visit: Payer: Self-pay

## 2020-02-13 DIAGNOSIS — R079 Chest pain, unspecified: Secondary | ICD-10-CM | POA: Insufficient documentation

## 2020-02-13 DIAGNOSIS — Z5321 Procedure and treatment not carried out due to patient leaving prior to being seen by health care provider: Secondary | ICD-10-CM | POA: Insufficient documentation

## 2020-02-13 LAB — BASIC METABOLIC PANEL
Anion gap: 12 (ref 5–15)
BUN: 14 mg/dL (ref 6–20)
CO2: 24 mmol/L (ref 22–32)
Calcium: 9.1 mg/dL (ref 8.9–10.3)
Chloride: 107 mmol/L (ref 98–111)
Creatinine, Ser: 0.77 mg/dL (ref 0.44–1.00)
GFR calc non Af Amer: >60 mL/min (ref >60)
Glucose, Bld: 86 mg/dL (ref 70–99)
Potassium: 4.2 mmol/L (ref 3.5–5.1)
Sodium: 143 mmol/L (ref 135–145)

## 2020-02-13 LAB — CBC
HCT: 36.7 % (ref 36.0–46.0)
Hemoglobin: 11.6 g/dL — ABNORMAL LOW (ref 12.0–15.0)
MCH: 21.3 pg — ABNORMAL LOW (ref 26.0–34.0)
MCHC: 31.6 g/dL (ref 30.0–36.0)
MCV: 67.3 fL — ABNORMAL LOW (ref 80.0–100.0)
Platelets: 199 10*3/uL (ref 150–400)
RBC: 5.45 MIL/uL — ABNORMAL HIGH (ref 3.87–5.11)
RDW: 17.9 % — ABNORMAL HIGH (ref 11.5–15.5)
WBC: 7.9 10*3/uL (ref 4.0–10.5)
nRBC: 0 % (ref 0.0–0.2)

## 2020-02-13 LAB — TROPONIN I (HIGH SENSITIVITY): Troponin I (High Sensitivity): 2 ng/L (ref <18)

## 2020-02-13 NOTE — ED Triage Notes (Signed)
Pt here with CP that started last night and is still the same. Pain is central and does not radiate anywhere. Pt is A&Ox4 and NAD in triage.

## 2020-02-13 NOTE — ED Notes (Signed)
Pt reported she was leaving due to the wait

## 2020-02-17 ENCOUNTER — Telehealth: Payer: Self-pay | Admitting: Emergency Medicine

## 2020-02-17 NOTE — Telephone Encounter (Signed)
Called patient due to lwot to inquire about condition and follow up plans.  No answer and no voicemail  

## 2020-02-24 NOTE — Telephone Encounter (Signed)
Called patient again due to left without treatment and has chest xray with possible telemetry pad, but if no tele pad on patient needs chest ct.  No answer and no voicemail.

## 2020-02-26 ENCOUNTER — Telehealth: Payer: Self-pay | Admitting: *Deleted

## 2020-02-26 ENCOUNTER — Encounter: Payer: Self-pay | Admitting: *Deleted

## 2020-02-26 NOTE — Telephone Encounter (Signed)
Attempted to contact patient regarding Xray result with need for follow up. There is no answer or voicemail option at phone # listed in EMR. Will mail notification.

## 2020-04-30 ENCOUNTER — Emergency Department
Admission: EM | Admit: 2020-04-30 | Discharge: 2020-04-30 | Disposition: A | Discharge status: Home / Self Care | Payer: Self-pay | Attending: Emergency Medicine | Admitting: Emergency Medicine

## 2020-04-30 ENCOUNTER — Encounter: Payer: Self-pay | Admitting: Emergency Medicine

## 2020-04-30 ENCOUNTER — Other Ambulatory Visit: Payer: Self-pay

## 2020-04-30 DIAGNOSIS — F17200 Nicotine dependence, unspecified, uncomplicated: Secondary | ICD-10-CM | POA: Insufficient documentation

## 2020-04-30 DIAGNOSIS — H81399 Other peripheral vertigo, unspecified ear: Secondary | ICD-10-CM | POA: Insufficient documentation

## 2020-04-30 LAB — CBC WITH DIFFERENTIAL/PLATELET
Abs Immature Granulocytes: 0.01 10*3/uL (ref 0.00–0.07)
Basophils Absolute: 0 10*3/uL (ref 0.0–0.1)
Basophils Relative: 1 %
Eosinophils Absolute: 0.1 10*3/uL (ref 0.0–0.5)
Eosinophils Relative: 2 %
HCT: 36.2 % (ref 36.0–46.0)
Hemoglobin: 11.4 g/dL — ABNORMAL LOW (ref 12.0–15.0)
Immature Granulocytes: 0 %
Lymphocytes Relative: 42 %
Lymphs Abs: 2.5 10*3/uL (ref 0.7–4.0)
MCH: 21.2 pg — ABNORMAL LOW (ref 26.0–34.0)
MCHC: 31.5 g/dL (ref 30.0–36.0)
MCV: 67.2 fL — ABNORMAL LOW (ref 80.0–100.0)
Monocytes Absolute: 0.4 10*3/uL (ref 0.1–1.0)
Monocytes Relative: 7 %
Neutro Abs: 3 10*3/uL (ref 1.7–7.7)
Neutrophils Relative %: 48 %
Platelets: 228 10*3/uL (ref 150–400)
RBC: 5.39 MIL/uL — ABNORMAL HIGH (ref 3.87–5.11)
RDW: 15.3 % (ref 11.5–15.5)
Smear Review: NORMAL
WBC: 6.1 10*3/uL (ref 4.0–10.5)
nRBC: 0 % (ref 0.0–0.2)

## 2020-04-30 LAB — COMPREHENSIVE METABOLIC PANEL
ALT: 15 U/L (ref 0–44)
AST: 17 U/L (ref 15–41)
Albumin: 3.7 g/dL (ref 3.5–5.0)
Alkaline Phosphatase: 121 U/L (ref 38–126)
Anion gap: 8 (ref 5–15)
BUN: 11 mg/dL (ref 6–20)
CO2: 26 mmol/L (ref 22–32)
Calcium: 8.8 mg/dL — ABNORMAL LOW (ref 8.9–10.3)
Chloride: 106 mmol/L (ref 98–111)
Creatinine, Ser: 0.61 mg/dL (ref 0.44–1.00)
GFR, Estimated: >60 mL/min (ref >60)
Glucose, Bld: 95 mg/dL (ref 70–99)
Potassium: 3.6 mmol/L (ref 3.5–5.1)
Sodium: 140 mmol/L (ref 135–145)
Total Bilirubin: 0.5 mg/dL (ref 0.3–1.2)
Total Protein: 6.8 g/dL (ref 6.5–8.1)

## 2020-04-30 LAB — URINALYSIS, COMPLETE (UACMP) WITH MICROSCOPIC
Bilirubin Urine: NEGATIVE
Glucose, UA: NEGATIVE mg/dL
Ketones, ur: NEGATIVE mg/dL
Leukocytes,Ua: NEGATIVE
Nitrite: NEGATIVE
Protein, ur: NEGATIVE mg/dL
Specific Gravity, Urine: 1.025 (ref 1.005–1.030)
pH: 5 (ref 5.0–8.0)

## 2020-04-30 LAB — TROPONIN I (HIGH SENSITIVITY): Troponin I (High Sensitivity): <2 ng/L (ref <18)

## 2020-04-30 MED ORDER — MECLIZINE HCL 25 MG PO TABS
25.0000 mg | ORAL_TABLET | Freq: Once | ORAL | Status: AC
  Administered 2020-04-30: 25 mg via ORAL
  Filled 2020-04-30: qty 1

## 2020-04-30 MED ORDER — MECLIZINE HCL 25 MG PO TABS: 25.0000 mg | ORAL_TABLET | Freq: Three times a day (TID) | ORAL | 0 refills | Status: AC | PRN

## 2020-04-30 NOTE — ED Provider Notes (Signed)
[facility] Emergency Department Provider Note ____________________________________________   Event Date/Time   First MD Initiated Contact with Patient 04/30/20 0957     (approximate)  I have reviewed the triage vital signs and the nursing notes.   HISTORY  Chief Complaint Dizziness    HPI [facility] is a 54 y.o. female with PMH as noted below who presents with dizziness over the last 2 days, acute onset this past Tuesday morning.  The patient states that it is worst in the morning and when she moves around.  The dizziness feels like she is going to fall over and it feels like the room is moving.  When the dizziness happens, she sometimes has palpitations and sometimes feels lightheaded like she might pass out.  She states that if she closes her eyes and is still, it will resolve.  She denies any prior history of this.  She states that around the time the dizziness started she woke up with a clogged right ear although this has resolved.  She has no ear pain or hearing loss.  The patient denies any chest pain, shortness of breath, vomiting or diarrhea, severe headache, visual changes, or other acute symptoms.   Past Medical History:  Diagnosis Date  . Chronic constipation   . Crohn's colitis [facility])     Patient Active Problem List   Diagnosis Date Noted  . Crohn's colitis, with fistula ([place]) 09/07/2018  . Partial small bowel obstruction ([place])   . 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 Abdominal pain 08/25/2017  . Right lower quadrant abdominal pain   . Intussusception intestine ([place]) 10/02/2016    Past Surgical History:  Procedure Laterality Date  . COLONOSCOPY N/A 08/28/2017   Procedure: COLONOSCOPY;  Surgeon: Jonathon Bellows, MD;  Location: [facility] ENDOSCOPY;  Service: Gastroenterology;  Laterality: N/A;  . TUBAL LIGATION      Prior to Admission medications   Medication Sig Start Date End Date Taking? Authorizing Provider   meclizine (ANTIVERT) 25 MG tablet Take 1 tablet (25 mg total) by mouth 3 (three) times daily as needed for dizziness. 04/30/20   Arta Silence, MD    Allergies Patient has no known allergies.  No family history on file.  Social History Social History   Tobacco Use  . Smoking status: Current Every Day Smoker  . Smokeless tobacco: Never Used  Vaping Use  . Vaping Use: Never used  Substance Use Topics  . Alcohol use: No  . Drug use: Yes    Types: Marijuana    Comment: Occ    Review of Systems  Constitutional: No fever/chills. Eyes: No visual changes. ENT: No sore throat. Cardiovascular: Denies chest pain. Respiratory: Denies shortness of breath. Gastrointestinal: Positive for intermittent nausea.  No vomiting or diarrhea. Genitourinary: Negative for dysuria.  Musculoskeletal: Negative for back pain. Skin: Negative for rash. Neurological: Negative for headaches, focal weakness or numbness.   ____________________________________________   PHYSICAL EXAM:  VITAL SIGNS: ED Triage Vitals  Enc Vitals Group     BP 04/30/20 0633 110/68     Pulse Rate 04/30/20 0633 62     Resp 04/30/20 0633 17     Temp 04/30/20 0633 99.1 F (37.3 C)     Temp Source 04/30/20 0633 Oral     SpO2 04/30/20 0633 98 %     Weight 04/30/20 0633 163 lb (73.9 kg)     Height 04/30/20 0633 5' 7"  (1.702 m)     Head Circumference --  Peak Flow --      Pain Score 04/30/20 0643 0     Pain Loc --      Pain Edu? --      Excl. in [place]? --     Constitutional: Alert and oriented. Well appearing and in no acute distress. Eyes: Conjunctivae are normal.  EOMI.  PERRLA. Head: Atraumatic.  External ear canals and TMs appear normal. Nose: No congestion/rhinnorhea. Mouth/Throat: Mucous membranes are moist.   Neck: Normal range of motion.  Cardiovascular: Normal rate, regular rhythm. Good peripheral circulation. Respiratory: Normal respiratory effort.  No retractions. Gastrointestinal: No  distention.  Musculoskeletal: No lower extremity edema.  Extremities warm and well perfused.  Neurologic:  Normal speech and language.  Motor intact in all extremities.  Normal coordination with no ataxia on finger-to-nose.  No pronator drift.  No facial droop Skin:  Skin is warm and dry. No rash noted. Psychiatric: Mood and affect are normal. Speech and behavior are normal.  ____________________________________________   LABS (all labs ordered are listed, but only abnormal results are displayed)  Labs Reviewed  CBC WITH DIFFERENTIAL/PLATELET - Abnormal; Notable for the following components:      Result Value   RBC 5.39 (*)    Hemoglobin 11.4 (*)    MCV 67.2 (*)    MCH 21.2 (*)    All other components within normal limits  COMPREHENSIVE METABOLIC PANEL - Abnormal; Notable for the following components:   Calcium 8.8 (*)    All other components within normal limits  URINALYSIS, COMPLETE (UACMP) WITH MICROSCOPIC - Abnormal; Notable for the following components:   Color, Urine YELLOW (*)    APPearance CLEAR (*)    Hgb urine dipstick SMALL (*)    Bacteria, UA RARE (*)    All other components within normal limits  TROPONIN I (HIGH SENSITIVITY)   ____________________________________________  EKG  ED ECG REPORT I, Arta Silence, the attending physician, personally viewed and interpreted this ECG.  Date: 04/30/2020 EKG Time: 0637 Rate: 60 Rhythm: normal sinus rhythm QRS Axis: normal Intervals: normal ST/T Wave abnormalities: normal Narrative Interpretation: no evidence of acute ischemia  ____________________________________________  RADIOLOGY    ____________________________________________   PROCEDURES  Procedure(s) performed: No  Procedures  Critical Care performed: No ____________________________________________   INITIAL IMPRESSION / ASSESSMENT AND PLAN / ED COURSE  Pertinent labs & imaging results that were available during my care of the patient  were reviewed by me and considered in my medical decision making (see chart for details).  54 year old female with PMH as noted above including Crohn's presents with dizziness over the last 2 days which is acute onset, feels like movement, and is extinguishable by being still and closing her eyes.  She has some associated palpitations and lightheadedness at times.  I reviewed the past medical records in [place].  The patient was last admitted in May 2020 for a Crohn's flare and has had no ED visits or admissions since that time.  She has no prior history of vertigo.  On exam she is well-appearing.  Vital signs are normal.  Physical exam is unremarkable.  Neurologic exam is nonfocal.  EKG is nonischemic and initial lab work-up including a troponin is negative for acute findings.  The patient is mildly anemic with no change in her hemoglobin from baseline.  Overall presentation is consistent with peripheral vertigo.  There is no evidence of CNS etiology given the intermittent nature of the symptoms and the normal neurologic exam.  The clogged ear when the  symptoms started goes along with a peripheral etiology.  There is no evidence of dehydration, metabolic abnormality, ACS, or other acute cardiac cause based on the current work-up.  There is no indication for repeat troponin given the duration of the symptoms.  There is no indication for brain imaging given the normal neurologic exam.  We will treat symptomatically with meclizine, obtain urinalysis, and reassess.  ----------------------------------------- 12:31 PM on 04/30/2020 -----------------------------------------  Urinalysis shows a few RBCs but is otherwise unremarkable.  The patient is feeling better after meclizine.  She is stable for discharge at this time.  Return precautions given, and she expresses understanding.  ____________________________________________   FINAL CLINICAL IMPRESSION(S) / ED DIAGNOSES  Final diagnoses:   Peripheral vertigo, unspecified laterality      NEW MEDICATIONS STARTED DURING THIS VISIT:  New Prescriptions   MECLIZINE (ANTIVERT) 25 MG TABLET    Take 1 tablet (25 mg total) by mouth 3 (three) times daily as needed for dizziness.     Note:  This document was prepared using Dragon voice recognition software and may include unintentional dictation errors.    Arta Silence, MD 04/30/20 [phone]

## 2020-04-30 NOTE — ED Notes (Signed)
Pt verbalized understanding of d/c instructions and denies further questions at this time. Pt ambulatory to lobby with steady gait noted at this time

## 2020-04-30 NOTE — Discharge Instructions (Signed)
Take the meclizine as needed for dizziness.  Return to the ER for new, worsening, or persistent severe dizziness, weakness or lightheadedness, feeling like you are going to pass out, severe headache, visual changes, numbness, chest pain, or any other new or worsening symptoms that concern you.

## 2020-04-30 NOTE — ED Triage Notes (Signed)
Pt to triage via w/c with no distress noted; pt reports since Wed having intermittent dizziness and "heart fluttering"; denies hx of same

## 2021-01-28 IMAGING — CR DG CHEST 2V
1 series · 2 of 2 positions shown · non-contrast
Comparison: 02/05/2016

CLINICAL DATA: Chest pain

EXAM:
CHEST - 2 VIEW

[Series 1: w chest pa · 0.14mm/px · 2 of 2 slices shown]
[im 1/2]
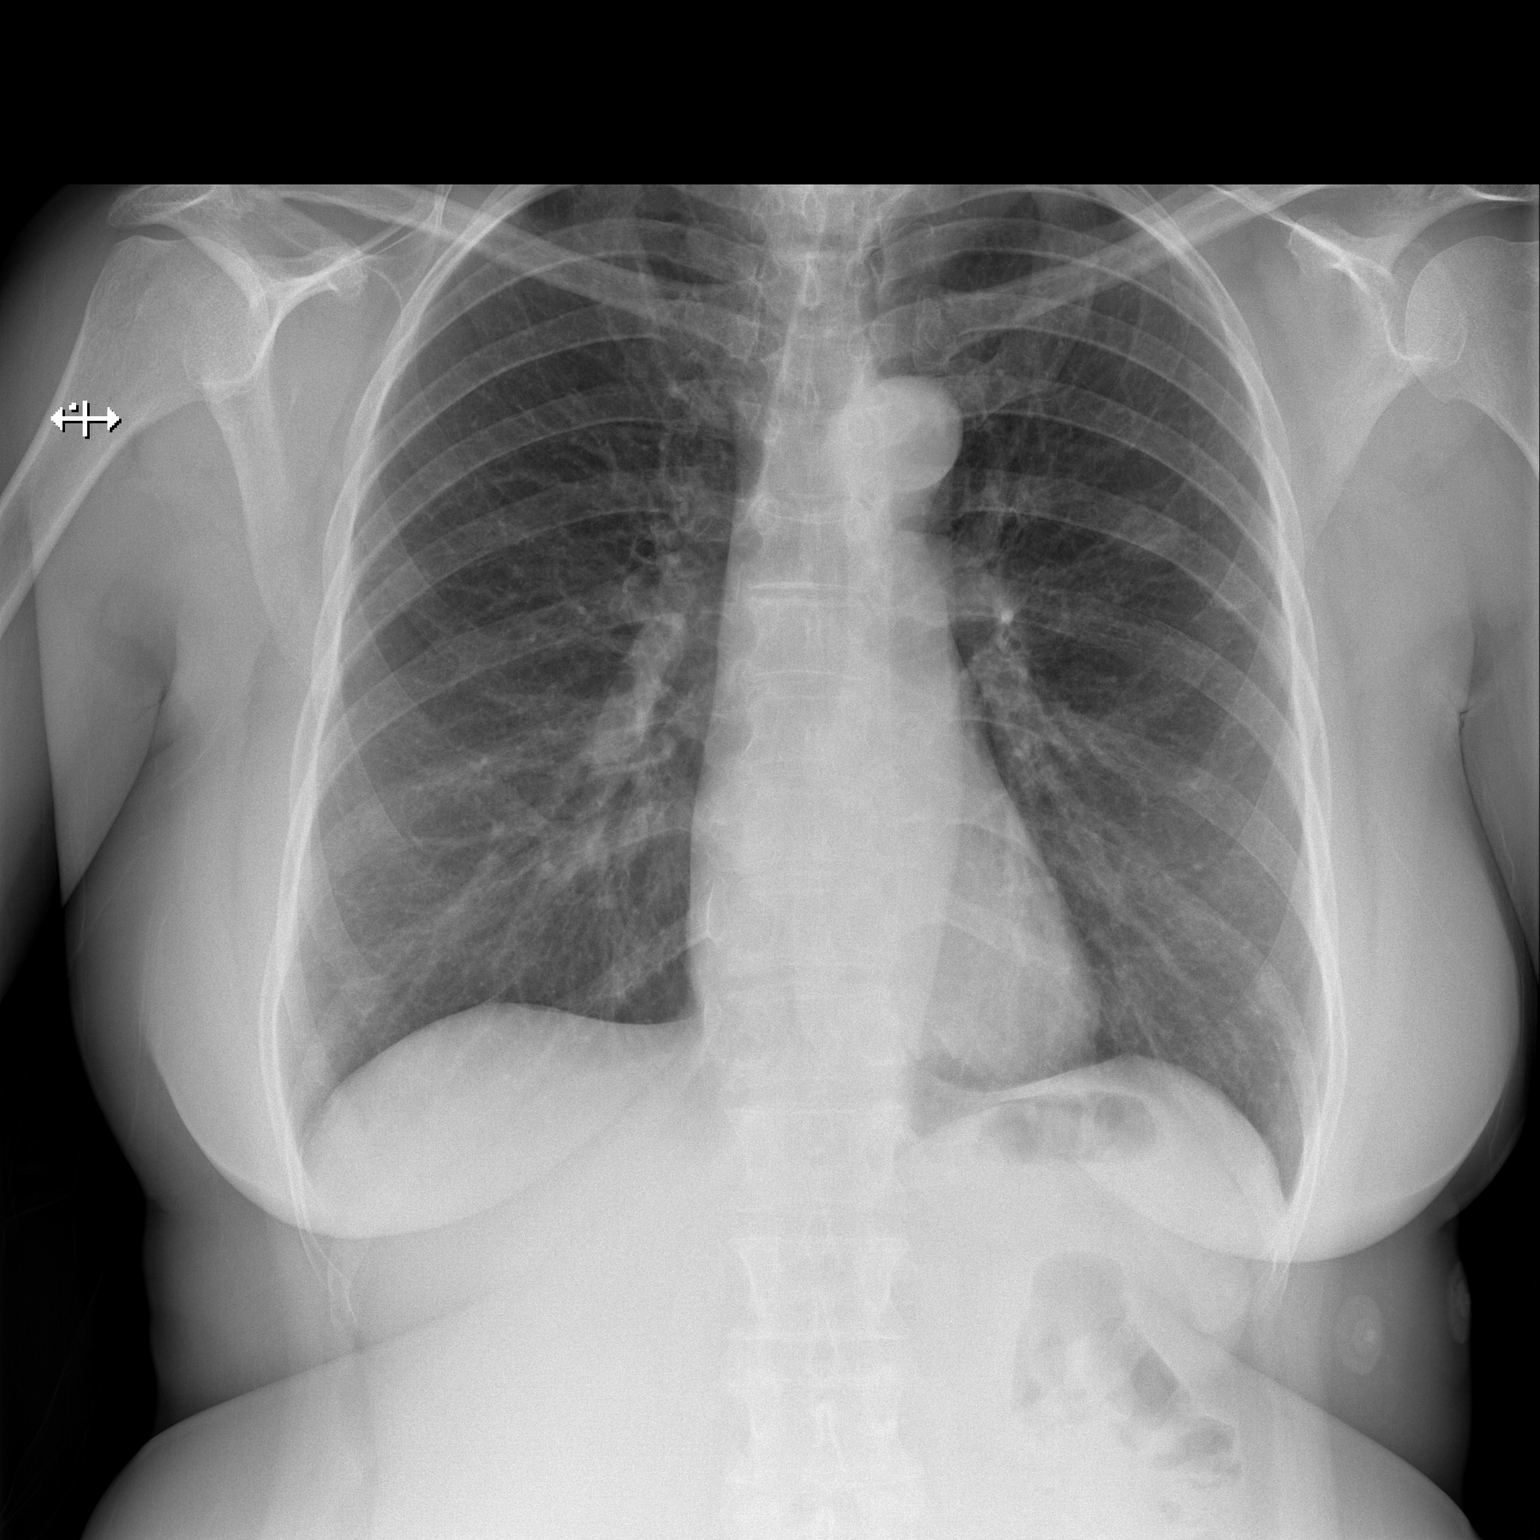
[im 2/2]
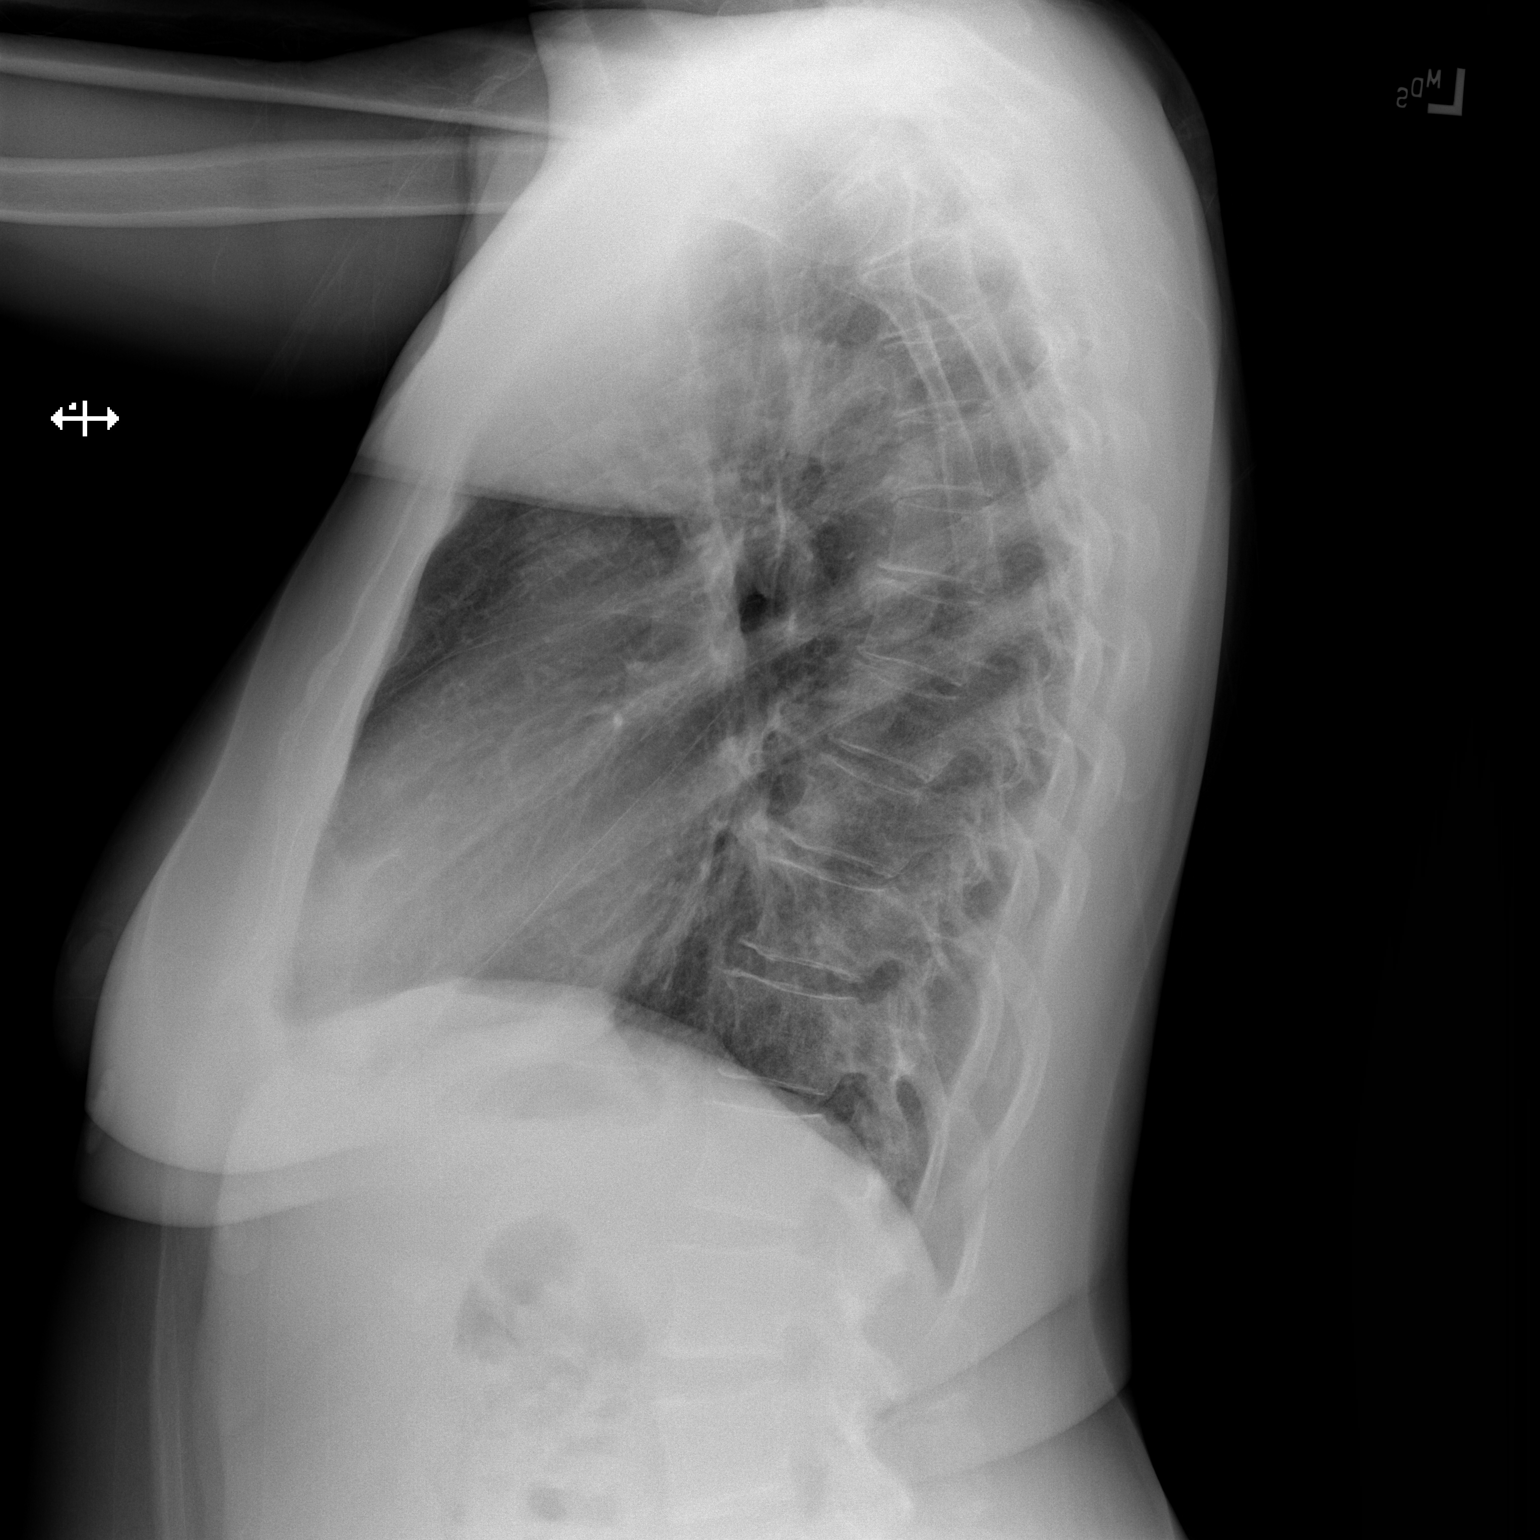

[2 of 2 positions shown; findings below may reference images not displayed]

FINDINGS: The lungs are clear without focal pneumonia, edema, pneumothorax or
pleural effusion. Nodular density/densities projecting over the left
lung (at the level of the anterior left third rib) is probably a
telemetry pad. The cardiopericardial silhouette is within normal
limits for size. The visualized bony structures of the thorax show
no acute abnormality.
IMPRESSION: 1. No acute cardiopulmonary findings.
2. Nodular density/densities over the left lung likely a telemetry
pad. If there are no pads over the left chest, dedicated chest CT
without contrast recommended to exclude pulmonary nodule.
